# Patient Record
Sex: Female | Born: 1960 | Race: White | Hispanic: No | Marital: Married | Smoking: Never smoker
Health system: Southern US, Community
[De-identification: ages and names within clinical notes are randomized; demographics above are authoritative.]

## PROBLEM LIST (undated history)

## (undated) DIAGNOSIS — M069 Rheumatoid arthritis, unspecified: Secondary | ICD-10-CM

## (undated) DIAGNOSIS — M25541 Pain in joints of right hand: Secondary | ICD-10-CM

## (undated) DIAGNOSIS — M6530 Trigger finger, unspecified finger: Secondary | ICD-10-CM

## (undated) DIAGNOSIS — Z9109 Other allergy status, other than to drugs and biological substances: Secondary | ICD-10-CM

## (undated) DIAGNOSIS — M542 Cervicalgia: Secondary | ICD-10-CM

## (undated) DIAGNOSIS — J309 Allergic rhinitis, unspecified: Secondary | ICD-10-CM

## (undated) HISTORY — PX: NO PAST SURGERIES: SHX2092

---
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
## 1999-03-06 ENCOUNTER — Other Ambulatory Visit: Admission: RE | Admit: 1999-03-06 | Discharge: 1999-03-06 | Payer: Self-pay | Admitting: Obstetrics and Gynecology

## 2000-05-09 ENCOUNTER — Other Ambulatory Visit: Admission: RE | Admit: 2000-05-09 | Discharge: 2000-05-09 | Payer: Self-pay | Admitting: Obstetrics and Gynecology

## 2001-09-07 ENCOUNTER — Other Ambulatory Visit: Admission: RE | Admit: 2001-09-07 | Discharge: 2001-09-07 | Payer: Self-pay | Admitting: Obstetrics and Gynecology

## 2002-12-13 ENCOUNTER — Other Ambulatory Visit: Admission: RE | Admit: 2002-12-13 | Discharge: 2002-12-13 | Payer: Self-pay | Admitting: Obstetrics and Gynecology

## 2004-08-15 ENCOUNTER — Other Ambulatory Visit: Admission: RE | Admit: 2004-08-15 | Discharge: 2004-08-15 | Payer: Self-pay | Admitting: Obstetrics and Gynecology

## 2004-08-24 ENCOUNTER — Encounter: Admission: RE | Admit: 2004-08-24 | Discharge: 2004-08-24 | Payer: Self-pay | Admitting: Obstetrics and Gynecology

## 2005-10-03 ENCOUNTER — Other Ambulatory Visit: Admission: RE | Admit: 2005-10-03 | Discharge: 2005-10-03 | Payer: Self-pay | Admitting: Obstetrics and Gynecology

## 2011-07-24 DIAGNOSIS — Z9109 Other allergy status, other than to drugs and biological substances: Secondary | ICD-10-CM

## 2011-07-24 HISTORY — DX: Other allergy status, other than to drugs and biological substances: Z91.09

## 2013-08-05 ENCOUNTER — Other Ambulatory Visit: Payer: Self-pay | Admitting: Obstetrics and Gynecology

## 2013-08-05 DIAGNOSIS — R928 Other abnormal and inconclusive findings on diagnostic imaging of breast: Secondary | ICD-10-CM

## 2013-08-20 ENCOUNTER — Ambulatory Visit
Admission: RE | Admit: 2013-08-20 | Discharge: 2013-08-20 | Disposition: A | Discharge status: Home / Self Care | Payer: BC Managed Care – PPO | Source: Ambulatory Visit | Attending: Obstetrics and Gynecology | Admitting: Obstetrics and Gynecology

## 2013-08-20 DIAGNOSIS — R928 Other abnormal and inconclusive findings on diagnostic imaging of breast: Secondary | ICD-10-CM

## 2014-08-09 ENCOUNTER — Other Ambulatory Visit: Payer: Self-pay | Admitting: Obstetrics and Gynecology

## 2014-08-09 DIAGNOSIS — R921 Mammographic calcification found on diagnostic imaging of breast: Secondary | ICD-10-CM

## 2014-08-16 ENCOUNTER — Other Ambulatory Visit: Payer: Self-pay | Admitting: Obstetrics and Gynecology

## 2014-08-16 DIAGNOSIS — Z780 Asymptomatic menopausal state: Secondary | ICD-10-CM

## 2014-09-01 ENCOUNTER — Ambulatory Visit
Admission: RE | Admit: 2014-09-01 | Discharge: 2014-09-01 | Disposition: A | Discharge status: Home / Self Care | Payer: BC Managed Care – PPO | Source: Ambulatory Visit | Attending: Obstetrics and Gynecology | Admitting: Obstetrics and Gynecology

## 2014-09-01 DIAGNOSIS — R921 Mammographic calcification found on diagnostic imaging of breast: Secondary | ICD-10-CM

## 2014-09-01 DIAGNOSIS — Z780 Asymptomatic menopausal state: Secondary | ICD-10-CM

## 2014-12-14 DIAGNOSIS — J302 Other seasonal allergic rhinitis: Secondary | ICD-10-CM

## 2014-12-14 DIAGNOSIS — J309 Allergic rhinitis, unspecified: Secondary | ICD-10-CM

## 2014-12-14 HISTORY — DX: Allergic rhinitis, unspecified: J30.9

## 2014-12-14 HISTORY — DX: Other seasonal allergic rhinitis: J30.2

## 2016-05-20 DIAGNOSIS — M545 Low back pain: Secondary | ICD-10-CM | POA: Diagnosis not present

## 2016-05-20 DIAGNOSIS — M542 Cervicalgia: Secondary | ICD-10-CM | POA: Diagnosis not present

## 2016-05-20 DIAGNOSIS — M791 Myalgia: Secondary | ICD-10-CM | POA: Diagnosis not present

## 2016-05-23 DIAGNOSIS — M5384 Other specified dorsopathies, thoracic region: Secondary | ICD-10-CM | POA: Diagnosis not present

## 2016-05-23 DIAGNOSIS — M25552 Pain in left hip: Secondary | ICD-10-CM | POA: Diagnosis not present

## 2016-05-23 DIAGNOSIS — M4604 Spinal enthesopathy, thoracic region: Secondary | ICD-10-CM | POA: Diagnosis not present

## 2016-05-23 DIAGNOSIS — R51 Headache: Secondary | ICD-10-CM | POA: Diagnosis not present

## 2016-05-23 DIAGNOSIS — M62830 Muscle spasm of back: Secondary | ICD-10-CM | POA: Diagnosis not present

## 2016-05-23 DIAGNOSIS — M542 Cervicalgia: Secondary | ICD-10-CM | POA: Diagnosis not present

## 2016-05-29 DIAGNOSIS — M4603 Spinal enthesopathy, cervicothoracic region: Secondary | ICD-10-CM | POA: Diagnosis not present

## 2016-05-29 DIAGNOSIS — M25552 Pain in left hip: Secondary | ICD-10-CM | POA: Diagnosis not present

## 2016-05-29 DIAGNOSIS — M542 Cervicalgia: Secondary | ICD-10-CM | POA: Diagnosis not present

## 2016-05-29 DIAGNOSIS — M5383 Other specified dorsopathies, cervicothoracic region: Secondary | ICD-10-CM | POA: Diagnosis not present

## 2016-05-29 DIAGNOSIS — R51 Headache: Secondary | ICD-10-CM | POA: Diagnosis not present

## 2016-05-29 DIAGNOSIS — M62830 Muscle spasm of back: Secondary | ICD-10-CM | POA: Diagnosis not present

## 2016-06-04 DIAGNOSIS — R51 Headache: Secondary | ICD-10-CM | POA: Diagnosis not present

## 2016-06-04 DIAGNOSIS — M4602 Spinal enthesopathy, cervical region: Secondary | ICD-10-CM | POA: Diagnosis not present

## 2016-06-04 DIAGNOSIS — M5382 Other specified dorsopathies, cervical region: Secondary | ICD-10-CM | POA: Diagnosis not present

## 2016-06-04 DIAGNOSIS — M62830 Muscle spasm of back: Secondary | ICD-10-CM | POA: Diagnosis not present

## 2016-06-04 DIAGNOSIS — M25552 Pain in left hip: Secondary | ICD-10-CM | POA: Diagnosis not present

## 2016-06-04 DIAGNOSIS — M542 Cervicalgia: Secondary | ICD-10-CM | POA: Diagnosis not present

## 2016-06-06 DIAGNOSIS — M62830 Muscle spasm of back: Secondary | ICD-10-CM | POA: Diagnosis not present

## 2016-06-06 DIAGNOSIS — M542 Cervicalgia: Secondary | ICD-10-CM | POA: Diagnosis not present

## 2016-06-06 DIAGNOSIS — R51 Headache: Secondary | ICD-10-CM | POA: Diagnosis not present

## 2016-06-06 DIAGNOSIS — M25552 Pain in left hip: Secondary | ICD-10-CM | POA: Diagnosis not present

## 2016-06-11 DIAGNOSIS — R51 Headache: Secondary | ICD-10-CM | POA: Diagnosis not present

## 2016-06-11 DIAGNOSIS — M25552 Pain in left hip: Secondary | ICD-10-CM | POA: Diagnosis not present

## 2016-06-11 DIAGNOSIS — M542 Cervicalgia: Secondary | ICD-10-CM | POA: Diagnosis not present

## 2016-06-11 DIAGNOSIS — M62830 Muscle spasm of back: Secondary | ICD-10-CM | POA: Diagnosis not present

## 2016-06-13 DIAGNOSIS — R51 Headache: Secondary | ICD-10-CM | POA: Diagnosis not present

## 2016-06-13 DIAGNOSIS — M25552 Pain in left hip: Secondary | ICD-10-CM | POA: Diagnosis not present

## 2016-06-13 DIAGNOSIS — M542 Cervicalgia: Secondary | ICD-10-CM | POA: Diagnosis not present

## 2016-06-13 DIAGNOSIS — M4603 Spinal enthesopathy, cervicothoracic region: Secondary | ICD-10-CM | POA: Diagnosis not present

## 2016-06-13 DIAGNOSIS — M62830 Muscle spasm of back: Secondary | ICD-10-CM | POA: Diagnosis not present

## 2016-06-13 DIAGNOSIS — M5383 Other specified dorsopathies, cervicothoracic region: Secondary | ICD-10-CM | POA: Diagnosis not present

## 2016-06-18 DIAGNOSIS — R51 Headache: Secondary | ICD-10-CM | POA: Diagnosis not present

## 2016-06-18 DIAGNOSIS — M4602 Spinal enthesopathy, cervical region: Secondary | ICD-10-CM | POA: Diagnosis not present

## 2016-06-18 DIAGNOSIS — M542 Cervicalgia: Secondary | ICD-10-CM | POA: Diagnosis not present

## 2016-06-18 DIAGNOSIS — M5382 Other specified dorsopathies, cervical region: Secondary | ICD-10-CM | POA: Diagnosis not present

## 2016-06-18 DIAGNOSIS — M25552 Pain in left hip: Secondary | ICD-10-CM | POA: Diagnosis not present

## 2016-06-18 DIAGNOSIS — M62830 Muscle spasm of back: Secondary | ICD-10-CM | POA: Diagnosis not present

## 2016-06-20 DIAGNOSIS — M4606 Spinal enthesopathy, lumbar region: Secondary | ICD-10-CM | POA: Diagnosis not present

## 2016-06-20 DIAGNOSIS — M62830 Muscle spasm of back: Secondary | ICD-10-CM | POA: Diagnosis not present

## 2016-06-20 DIAGNOSIS — R51 Headache: Secondary | ICD-10-CM | POA: Diagnosis not present

## 2016-06-20 DIAGNOSIS — M5386 Other specified dorsopathies, lumbar region: Secondary | ICD-10-CM | POA: Diagnosis not present

## 2016-06-20 DIAGNOSIS — M542 Cervicalgia: Secondary | ICD-10-CM | POA: Diagnosis not present

## 2016-06-20 DIAGNOSIS — M25552 Pain in left hip: Secondary | ICD-10-CM | POA: Diagnosis not present

## 2016-06-25 DIAGNOSIS — M25552 Pain in left hip: Secondary | ICD-10-CM | POA: Diagnosis not present

## 2016-06-25 DIAGNOSIS — R51 Headache: Secondary | ICD-10-CM | POA: Diagnosis not present

## 2016-06-25 DIAGNOSIS — M5383 Other specified dorsopathies, cervicothoracic region: Secondary | ICD-10-CM | POA: Diagnosis not present

## 2016-06-25 DIAGNOSIS — M542 Cervicalgia: Secondary | ICD-10-CM | POA: Diagnosis not present

## 2016-06-25 DIAGNOSIS — M4603 Spinal enthesopathy, cervicothoracic region: Secondary | ICD-10-CM | POA: Diagnosis not present

## 2016-06-25 DIAGNOSIS — M62830 Muscle spasm of back: Secondary | ICD-10-CM | POA: Diagnosis not present

## 2016-06-27 DIAGNOSIS — M4603 Spinal enthesopathy, cervicothoracic region: Secondary | ICD-10-CM | POA: Diagnosis not present

## 2016-06-27 DIAGNOSIS — M542 Cervicalgia: Secondary | ICD-10-CM | POA: Diagnosis not present

## 2016-06-27 DIAGNOSIS — M62830 Muscle spasm of back: Secondary | ICD-10-CM | POA: Diagnosis not present

## 2016-06-27 DIAGNOSIS — R51 Headache: Secondary | ICD-10-CM | POA: Diagnosis not present

## 2016-06-27 DIAGNOSIS — M5383 Other specified dorsopathies, cervicothoracic region: Secondary | ICD-10-CM | POA: Diagnosis not present

## 2016-06-27 DIAGNOSIS — M25552 Pain in left hip: Secondary | ICD-10-CM | POA: Diagnosis not present

## 2016-07-02 DIAGNOSIS — M62830 Muscle spasm of back: Secondary | ICD-10-CM | POA: Diagnosis not present

## 2016-07-02 DIAGNOSIS — M25552 Pain in left hip: Secondary | ICD-10-CM | POA: Diagnosis not present

## 2016-07-02 DIAGNOSIS — R51 Headache: Secondary | ICD-10-CM | POA: Diagnosis not present

## 2016-07-02 DIAGNOSIS — M542 Cervicalgia: Secondary | ICD-10-CM | POA: Diagnosis not present

## 2016-07-04 DIAGNOSIS — M62830 Muscle spasm of back: Secondary | ICD-10-CM | POA: Diagnosis not present

## 2016-07-04 DIAGNOSIS — M25522 Pain in left elbow: Secondary | ICD-10-CM | POA: Diagnosis not present

## 2016-07-04 DIAGNOSIS — R51 Headache: Secondary | ICD-10-CM | POA: Diagnosis not present

## 2016-07-04 DIAGNOSIS — M542 Cervicalgia: Secondary | ICD-10-CM | POA: Diagnosis not present

## 2016-07-09 DIAGNOSIS — R51 Headache: Secondary | ICD-10-CM | POA: Diagnosis not present

## 2016-07-09 DIAGNOSIS — M25552 Pain in left hip: Secondary | ICD-10-CM | POA: Diagnosis not present

## 2016-07-09 DIAGNOSIS — M62830 Muscle spasm of back: Secondary | ICD-10-CM | POA: Diagnosis not present

## 2016-07-09 DIAGNOSIS — M542 Cervicalgia: Secondary | ICD-10-CM | POA: Diagnosis not present

## 2016-07-11 DIAGNOSIS — H01004 Unspecified blepharitis left upper eyelid: Secondary | ICD-10-CM | POA: Diagnosis not present

## 2016-07-11 DIAGNOSIS — H01002 Unspecified blepharitis right lower eyelid: Secondary | ICD-10-CM | POA: Diagnosis not present

## 2016-07-11 DIAGNOSIS — M25552 Pain in left hip: Secondary | ICD-10-CM | POA: Diagnosis not present

## 2016-07-11 DIAGNOSIS — H524 Presbyopia: Secondary | ICD-10-CM | POA: Diagnosis not present

## 2016-07-11 DIAGNOSIS — M542 Cervicalgia: Secondary | ICD-10-CM | POA: Diagnosis not present

## 2016-07-11 DIAGNOSIS — M62830 Muscle spasm of back: Secondary | ICD-10-CM | POA: Diagnosis not present

## 2016-07-11 DIAGNOSIS — H01005 Unspecified blepharitis left lower eyelid: Secondary | ICD-10-CM | POA: Diagnosis not present

## 2016-07-11 DIAGNOSIS — H5203 Hypermetropia, bilateral: Secondary | ICD-10-CM | POA: Diagnosis not present

## 2016-07-11 DIAGNOSIS — R51 Headache: Secondary | ICD-10-CM | POA: Diagnosis not present

## 2016-07-11 DIAGNOSIS — H01001 Unspecified blepharitis right upper eyelid: Secondary | ICD-10-CM | POA: Diagnosis not present

## 2016-07-17 DIAGNOSIS — M25552 Pain in left hip: Secondary | ICD-10-CM | POA: Diagnosis not present

## 2016-07-17 DIAGNOSIS — M542 Cervicalgia: Secondary | ICD-10-CM | POA: Diagnosis not present

## 2016-07-17 DIAGNOSIS — R51 Headache: Secondary | ICD-10-CM | POA: Diagnosis not present

## 2016-07-17 DIAGNOSIS — M62830 Muscle spasm of back: Secondary | ICD-10-CM | POA: Diagnosis not present

## 2016-07-18 DIAGNOSIS — M542 Cervicalgia: Secondary | ICD-10-CM | POA: Diagnosis not present

## 2016-07-18 DIAGNOSIS — M25552 Pain in left hip: Secondary | ICD-10-CM | POA: Diagnosis not present

## 2016-07-18 DIAGNOSIS — R51 Headache: Secondary | ICD-10-CM | POA: Diagnosis not present

## 2016-07-18 DIAGNOSIS — M62830 Muscle spasm of back: Secondary | ICD-10-CM | POA: Diagnosis not present

## 2016-07-23 DIAGNOSIS — M25552 Pain in left hip: Secondary | ICD-10-CM | POA: Diagnosis not present

## 2016-07-23 DIAGNOSIS — M542 Cervicalgia: Secondary | ICD-10-CM | POA: Diagnosis not present

## 2016-07-23 DIAGNOSIS — M62830 Muscle spasm of back: Secondary | ICD-10-CM | POA: Diagnosis not present

## 2016-07-23 DIAGNOSIS — R51 Headache: Secondary | ICD-10-CM | POA: Diagnosis not present

## 2016-07-24 DIAGNOSIS — R51 Headache: Secondary | ICD-10-CM | POA: Diagnosis not present

## 2016-07-24 DIAGNOSIS — M62830 Muscle spasm of back: Secondary | ICD-10-CM | POA: Diagnosis not present

## 2016-07-24 DIAGNOSIS — M25552 Pain in left hip: Secondary | ICD-10-CM | POA: Diagnosis not present

## 2016-07-24 DIAGNOSIS — M542 Cervicalgia: Secondary | ICD-10-CM | POA: Diagnosis not present

## 2016-08-07 DIAGNOSIS — M25552 Pain in left hip: Secondary | ICD-10-CM | POA: Diagnosis not present

## 2016-08-07 DIAGNOSIS — M542 Cervicalgia: Secondary | ICD-10-CM | POA: Diagnosis not present

## 2016-08-07 DIAGNOSIS — R51 Headache: Secondary | ICD-10-CM | POA: Diagnosis not present

## 2016-08-07 DIAGNOSIS — M62830 Muscle spasm of back: Secondary | ICD-10-CM | POA: Diagnosis not present

## 2016-08-13 DIAGNOSIS — R51 Headache: Secondary | ICD-10-CM | POA: Diagnosis not present

## 2016-08-13 DIAGNOSIS — M62830 Muscle spasm of back: Secondary | ICD-10-CM | POA: Diagnosis not present

## 2016-08-13 DIAGNOSIS — M542 Cervicalgia: Secondary | ICD-10-CM | POA: Diagnosis not present

## 2016-08-13 DIAGNOSIS — M25552 Pain in left hip: Secondary | ICD-10-CM | POA: Diagnosis not present

## 2016-08-20 DIAGNOSIS — R51 Headache: Secondary | ICD-10-CM | POA: Diagnosis not present

## 2016-08-20 DIAGNOSIS — M25552 Pain in left hip: Secondary | ICD-10-CM | POA: Diagnosis not present

## 2016-08-20 DIAGNOSIS — M62830 Muscle spasm of back: Secondary | ICD-10-CM | POA: Diagnosis not present

## 2016-08-20 DIAGNOSIS — M542 Cervicalgia: Secondary | ICD-10-CM | POA: Diagnosis not present

## 2016-10-22 DIAGNOSIS — M25552 Pain in left hip: Secondary | ICD-10-CM | POA: Diagnosis not present

## 2016-10-22 DIAGNOSIS — M62830 Muscle spasm of back: Secondary | ICD-10-CM | POA: Diagnosis not present

## 2016-10-22 DIAGNOSIS — R51 Headache: Secondary | ICD-10-CM | POA: Diagnosis not present

## 2016-10-22 DIAGNOSIS — M542 Cervicalgia: Secondary | ICD-10-CM | POA: Diagnosis not present

## 2016-11-28 DIAGNOSIS — J209 Acute bronchitis, unspecified: Secondary | ICD-10-CM | POA: Diagnosis not present

## 2017-07-16 DIAGNOSIS — M545 Low back pain: Secondary | ICD-10-CM | POA: Diagnosis not present

## 2017-07-16 DIAGNOSIS — M79644 Pain in right finger(s): Secondary | ICD-10-CM | POA: Diagnosis not present

## 2017-08-05 DIAGNOSIS — M2550 Pain in unspecified joint: Secondary | ICD-10-CM | POA: Diagnosis not present

## 2017-08-05 DIAGNOSIS — M549 Dorsalgia, unspecified: Secondary | ICD-10-CM | POA: Diagnosis not present

## 2017-08-05 DIAGNOSIS — R5381 Other malaise: Secondary | ICD-10-CM | POA: Diagnosis not present

## 2017-08-29 ENCOUNTER — Encounter: Payer: Self-pay | Admitting: Cardiology

## 2017-08-29 DIAGNOSIS — H9312 Tinnitus, left ear: Secondary | ICD-10-CM | POA: Diagnosis not present

## 2017-08-29 DIAGNOSIS — R002 Palpitations: Secondary | ICD-10-CM | POA: Diagnosis not present

## 2017-08-29 DIAGNOSIS — F5101 Primary insomnia: Secondary | ICD-10-CM | POA: Diagnosis not present

## 2017-10-08 DIAGNOSIS — H04123 Dry eye syndrome of bilateral lacrimal glands: Secondary | ICD-10-CM | POA: Diagnosis not present

## 2017-10-08 DIAGNOSIS — M65319 Trigger thumb, unspecified thumb: Secondary | ICD-10-CM | POA: Diagnosis not present

## 2017-10-08 DIAGNOSIS — R768 Other specified abnormal immunological findings in serum: Secondary | ICD-10-CM | POA: Diagnosis not present

## 2017-10-08 DIAGNOSIS — M25649 Stiffness of unspecified hand, not elsewhere classified: Secondary | ICD-10-CM | POA: Diagnosis not present

## 2017-10-22 DIAGNOSIS — H04123 Dry eye syndrome of bilateral lacrimal glands: Secondary | ICD-10-CM | POA: Diagnosis not present

## 2017-10-22 DIAGNOSIS — R768 Other specified abnormal immunological findings in serum: Secondary | ICD-10-CM | POA: Diagnosis not present

## 2018-01-12 DIAGNOSIS — M2550 Pain in unspecified joint: Secondary | ICD-10-CM | POA: Diagnosis not present

## 2018-01-12 DIAGNOSIS — R768 Other specified abnormal immunological findings in serum: Secondary | ICD-10-CM | POA: Diagnosis not present

## 2018-01-21 DIAGNOSIS — M25541 Pain in joints of right hand: Secondary | ICD-10-CM

## 2018-01-21 DIAGNOSIS — H16223 Keratoconjunctivitis sicca, not specified as Sjogren's, bilateral: Secondary | ICD-10-CM | POA: Diagnosis not present

## 2018-01-21 DIAGNOSIS — H0100A Unspecified blepharitis right eye, upper and lower eyelids: Secondary | ICD-10-CM | POA: Diagnosis not present

## 2018-01-21 DIAGNOSIS — H2513 Age-related nuclear cataract, bilateral: Secondary | ICD-10-CM | POA: Diagnosis not present

## 2018-01-21 DIAGNOSIS — H0100B Unspecified blepharitis left eye, upper and lower eyelids: Secondary | ICD-10-CM | POA: Diagnosis not present

## 2018-01-21 DIAGNOSIS — M25542 Pain in joints of left hand: Secondary | ICD-10-CM

## 2018-01-21 HISTORY — DX: Pain in joints of right hand: M25.541

## 2018-01-21 HISTORY — DX: Pain in joints of left hand: M25.542

## 2018-02-25 DIAGNOSIS — M5386 Other specified dorsopathies, lumbar region: Secondary | ICD-10-CM | POA: Diagnosis not present

## 2018-02-25 DIAGNOSIS — M9905 Segmental and somatic dysfunction of pelvic region: Secondary | ICD-10-CM | POA: Diagnosis not present

## 2018-02-25 DIAGNOSIS — M9902 Segmental and somatic dysfunction of thoracic region: Secondary | ICD-10-CM | POA: Diagnosis not present

## 2018-02-25 DIAGNOSIS — M9903 Segmental and somatic dysfunction of lumbar region: Secondary | ICD-10-CM | POA: Diagnosis not present

## 2018-02-26 DIAGNOSIS — M65312 Trigger thumb, left thumb: Secondary | ICD-10-CM | POA: Diagnosis not present

## 2018-02-26 DIAGNOSIS — M6530 Trigger finger, unspecified finger: Secondary | ICD-10-CM

## 2018-02-26 DIAGNOSIS — M79642 Pain in left hand: Secondary | ICD-10-CM | POA: Diagnosis not present

## 2018-02-26 DIAGNOSIS — M65311 Trigger thumb, right thumb: Secondary | ICD-10-CM | POA: Diagnosis not present

## 2018-02-26 DIAGNOSIS — M069 Rheumatoid arthritis, unspecified: Secondary | ICD-10-CM

## 2018-02-26 DIAGNOSIS — M79641 Pain in right hand: Secondary | ICD-10-CM | POA: Diagnosis not present

## 2018-02-26 HISTORY — DX: Trigger finger, unspecified finger: M65.30

## 2018-02-26 HISTORY — DX: Rheumatoid arthritis, unspecified: M06.9

## 2018-02-27 DIAGNOSIS — M9905 Segmental and somatic dysfunction of pelvic region: Secondary | ICD-10-CM | POA: Diagnosis not present

## 2018-02-27 DIAGNOSIS — M5386 Other specified dorsopathies, lumbar region: Secondary | ICD-10-CM | POA: Diagnosis not present

## 2018-02-27 DIAGNOSIS — M9903 Segmental and somatic dysfunction of lumbar region: Secondary | ICD-10-CM | POA: Diagnosis not present

## 2018-02-27 DIAGNOSIS — M9902 Segmental and somatic dysfunction of thoracic region: Secondary | ICD-10-CM | POA: Diagnosis not present

## 2018-03-02 DIAGNOSIS — M9903 Segmental and somatic dysfunction of lumbar region: Secondary | ICD-10-CM | POA: Diagnosis not present

## 2018-03-02 DIAGNOSIS — M5386 Other specified dorsopathies, lumbar region: Secondary | ICD-10-CM | POA: Diagnosis not present

## 2018-03-02 DIAGNOSIS — M9905 Segmental and somatic dysfunction of pelvic region: Secondary | ICD-10-CM | POA: Diagnosis not present

## 2018-03-02 DIAGNOSIS — M9902 Segmental and somatic dysfunction of thoracic region: Secondary | ICD-10-CM | POA: Diagnosis not present

## 2018-03-11 DIAGNOSIS — M9902 Segmental and somatic dysfunction of thoracic region: Secondary | ICD-10-CM | POA: Diagnosis not present

## 2018-03-11 DIAGNOSIS — M9905 Segmental and somatic dysfunction of pelvic region: Secondary | ICD-10-CM | POA: Diagnosis not present

## 2018-03-11 DIAGNOSIS — M5386 Other specified dorsopathies, lumbar region: Secondary | ICD-10-CM | POA: Diagnosis not present

## 2018-03-11 DIAGNOSIS — M9903 Segmental and somatic dysfunction of lumbar region: Secondary | ICD-10-CM | POA: Diagnosis not present

## 2018-03-13 DIAGNOSIS — M9903 Segmental and somatic dysfunction of lumbar region: Secondary | ICD-10-CM | POA: Diagnosis not present

## 2018-03-13 DIAGNOSIS — M9905 Segmental and somatic dysfunction of pelvic region: Secondary | ICD-10-CM | POA: Diagnosis not present

## 2018-03-13 DIAGNOSIS — M5386 Other specified dorsopathies, lumbar region: Secondary | ICD-10-CM | POA: Diagnosis not present

## 2018-03-13 DIAGNOSIS — M9902 Segmental and somatic dysfunction of thoracic region: Secondary | ICD-10-CM | POA: Diagnosis not present

## 2018-03-17 DIAGNOSIS — M5386 Other specified dorsopathies, lumbar region: Secondary | ICD-10-CM | POA: Diagnosis not present

## 2018-03-17 DIAGNOSIS — M9903 Segmental and somatic dysfunction of lumbar region: Secondary | ICD-10-CM | POA: Diagnosis not present

## 2018-03-17 DIAGNOSIS — M9902 Segmental and somatic dysfunction of thoracic region: Secondary | ICD-10-CM | POA: Diagnosis not present

## 2018-03-17 DIAGNOSIS — M9905 Segmental and somatic dysfunction of pelvic region: Secondary | ICD-10-CM | POA: Diagnosis not present

## 2018-03-19 DIAGNOSIS — M9902 Segmental and somatic dysfunction of thoracic region: Secondary | ICD-10-CM | POA: Diagnosis not present

## 2018-03-19 DIAGNOSIS — M9903 Segmental and somatic dysfunction of lumbar region: Secondary | ICD-10-CM | POA: Diagnosis not present

## 2018-03-19 DIAGNOSIS — M9905 Segmental and somatic dysfunction of pelvic region: Secondary | ICD-10-CM | POA: Diagnosis not present

## 2018-03-19 DIAGNOSIS — M5386 Other specified dorsopathies, lumbar region: Secondary | ICD-10-CM | POA: Diagnosis not present

## 2018-04-08 DIAGNOSIS — Z01419 Encounter for gynecological examination (general) (routine) without abnormal findings: Secondary | ICD-10-CM | POA: Diagnosis not present

## 2018-04-08 DIAGNOSIS — Z6820 Body mass index (BMI) 20.0-20.9, adult: Secondary | ICD-10-CM | POA: Diagnosis not present

## 2018-04-08 DIAGNOSIS — Z1231 Encounter for screening mammogram for malignant neoplasm of breast: Secondary | ICD-10-CM | POA: Diagnosis not present

## 2018-04-09 ENCOUNTER — Other Ambulatory Visit: Payer: Self-pay | Admitting: Obstetrics and Gynecology

## 2018-04-09 DIAGNOSIS — R928 Other abnormal and inconclusive findings on diagnostic imaging of breast: Secondary | ICD-10-CM

## 2018-04-13 ENCOUNTER — Ambulatory Visit
Admission: RE | Admit: 2018-04-13 | Discharge: 2018-04-13 | Disposition: A | Discharge status: Home / Self Care | Payer: BLUE CROSS/BLUE SHIELD | Source: Ambulatory Visit | Attending: Obstetrics and Gynecology | Admitting: Obstetrics and Gynecology

## 2018-04-13 DIAGNOSIS — M79672 Pain in left foot: Secondary | ICD-10-CM | POA: Diagnosis not present

## 2018-04-13 DIAGNOSIS — R928 Other abnormal and inconclusive findings on diagnostic imaging of breast: Secondary | ICD-10-CM

## 2018-04-13 DIAGNOSIS — R922 Inconclusive mammogram: Secondary | ICD-10-CM | POA: Diagnosis not present

## 2018-04-13 DIAGNOSIS — N6489 Other specified disorders of breast: Secondary | ICD-10-CM | POA: Diagnosis not present

## 2018-04-16 ENCOUNTER — Other Ambulatory Visit: Payer: Self-pay

## 2018-07-29 DIAGNOSIS — Z0000 Encounter for general adult medical examination without abnormal findings: Secondary | ICD-10-CM | POA: Diagnosis not present

## 2018-07-31 DIAGNOSIS — Z0000 Encounter for general adult medical examination without abnormal findings: Secondary | ICD-10-CM | POA: Diagnosis not present

## 2018-11-26 DIAGNOSIS — M542 Cervicalgia: Secondary | ICD-10-CM | POA: Diagnosis not present

## 2018-11-26 DIAGNOSIS — L040 Acute lymphadenitis of face, head and neck: Secondary | ICD-10-CM | POA: Diagnosis not present

## 2018-11-26 DIAGNOSIS — R51 Headache: Secondary | ICD-10-CM | POA: Diagnosis not present

## 2018-12-23 DIAGNOSIS — M9902 Segmental and somatic dysfunction of thoracic region: Secondary | ICD-10-CM | POA: Diagnosis not present

## 2018-12-23 DIAGNOSIS — M9905 Segmental and somatic dysfunction of pelvic region: Secondary | ICD-10-CM | POA: Diagnosis not present

## 2018-12-23 DIAGNOSIS — M542 Cervicalgia: Secondary | ICD-10-CM | POA: Diagnosis not present

## 2018-12-23 DIAGNOSIS — M9903 Segmental and somatic dysfunction of lumbar region: Secondary | ICD-10-CM | POA: Diagnosis not present

## 2018-12-23 DIAGNOSIS — G44209 Tension-type headache, unspecified, not intractable: Secondary | ICD-10-CM | POA: Diagnosis not present

## 2018-12-23 DIAGNOSIS — M791 Myalgia, unspecified site: Secondary | ICD-10-CM | POA: Diagnosis not present

## 2018-12-23 DIAGNOSIS — I10 Essential (primary) hypertension: Secondary | ICD-10-CM | POA: Diagnosis not present

## 2019-02-03 DIAGNOSIS — R51 Headache: Secondary | ICD-10-CM | POA: Diagnosis not present

## 2019-02-03 DIAGNOSIS — M542 Cervicalgia: Secondary | ICD-10-CM | POA: Diagnosis not present

## 2019-02-04 DIAGNOSIS — G4486 Cervicogenic headache: Secondary | ICD-10-CM

## 2019-02-04 DIAGNOSIS — M542 Cervicalgia: Secondary | ICD-10-CM

## 2019-02-04 HISTORY — DX: Cervicalgia: M54.2

## 2019-02-04 HISTORY — DX: Cervicogenic headache: G44.86

## 2019-02-10 DIAGNOSIS — R51 Headache: Secondary | ICD-10-CM | POA: Diagnosis not present

## 2019-02-10 DIAGNOSIS — R1310 Dysphagia, unspecified: Secondary | ICD-10-CM | POA: Diagnosis not present

## 2019-02-10 DIAGNOSIS — M542 Cervicalgia: Secondary | ICD-10-CM | POA: Diagnosis not present

## 2019-03-12 DIAGNOSIS — R002 Palpitations: Secondary | ICD-10-CM | POA: Diagnosis not present

## 2019-03-12 DIAGNOSIS — R51 Headache: Secondary | ICD-10-CM | POA: Diagnosis not present

## 2019-03-12 DIAGNOSIS — K219 Gastro-esophageal reflux disease without esophagitis: Secondary | ICD-10-CM | POA: Diagnosis not present

## 2019-03-16 ENCOUNTER — Telehealth: Payer: Self-pay | Admitting: *Deleted

## 2019-03-16 NOTE — Telephone Encounter (Signed)
REFERRAL SENT TO SCHEDULING AND NOTES ON FILE FROM DR. ALAN ROSS EAGLE AT [facility] [phone] °

## 2019-03-24 DIAGNOSIS — M542 Cervicalgia: Secondary | ICD-10-CM | POA: Diagnosis not present

## 2019-03-24 DIAGNOSIS — R51 Headache: Secondary | ICD-10-CM | POA: Diagnosis not present

## 2019-03-29 DIAGNOSIS — M542 Cervicalgia: Secondary | ICD-10-CM | POA: Diagnosis not present

## 2019-03-29 DIAGNOSIS — R51 Headache: Secondary | ICD-10-CM | POA: Diagnosis not present

## 2019-04-01 DIAGNOSIS — K219 Gastro-esophageal reflux disease without esophagitis: Secondary | ICD-10-CM | POA: Diagnosis not present

## 2019-04-01 DIAGNOSIS — R1310 Dysphagia, unspecified: Secondary | ICD-10-CM | POA: Diagnosis not present

## 2019-04-01 DIAGNOSIS — Z800 Family history of malignant neoplasm of digestive organs: Secondary | ICD-10-CM | POA: Diagnosis not present

## 2019-04-01 DIAGNOSIS — Z1211 Encounter for screening for malignant neoplasm of colon: Secondary | ICD-10-CM | POA: Diagnosis not present

## 2019-04-12 DIAGNOSIS — M542 Cervicalgia: Secondary | ICD-10-CM | POA: Diagnosis not present

## 2019-04-12 DIAGNOSIS — R51 Headache: Secondary | ICD-10-CM | POA: Diagnosis not present

## 2019-04-13 DIAGNOSIS — R05 Cough: Secondary | ICD-10-CM | POA: Diagnosis not present

## 2019-05-12 ENCOUNTER — Ambulatory Visit
Admission: RE | Admit: 2019-05-12 | Discharge: 2019-05-12 | Disposition: A | Discharge status: Home / Self Care | Payer: BLUE CROSS/BLUE SHIELD | Source: Ambulatory Visit | Attending: Family Medicine | Admitting: Family Medicine

## 2019-05-12 ENCOUNTER — Other Ambulatory Visit: Payer: Self-pay | Admitting: Family Medicine

## 2019-05-12 DIAGNOSIS — R059 Cough, unspecified: Secondary | ICD-10-CM

## 2019-05-12 DIAGNOSIS — R05 Cough: Secondary | ICD-10-CM | POA: Diagnosis not present

## 2019-05-18 DIAGNOSIS — L659 Nonscarring hair loss, unspecified: Secondary | ICD-10-CM | POA: Diagnosis not present

## 2019-05-21 ENCOUNTER — Other Ambulatory Visit: Payer: Self-pay

## 2019-05-31 ENCOUNTER — Telehealth: Payer: Self-pay | Admitting: *Deleted

## 2019-05-31 NOTE — Telephone Encounter (Signed)
Pt requesting old scratch test results. Located old chart and emailed to pt at [email or web address]

## 2019-06-11 ENCOUNTER — Other Ambulatory Visit: Payer: Self-pay

## 2019-06-11 ENCOUNTER — Encounter: Payer: Self-pay | Admitting: Cardiology

## 2019-06-11 ENCOUNTER — Ambulatory Visit (INDEPENDENT_AMBULATORY_CARE_PROVIDER_SITE_OTHER): Payer: BC Managed Care – PPO | Admitting: Cardiology

## 2019-06-11 DIAGNOSIS — R0609 Other forms of dyspnea: Secondary | ICD-10-CM

## 2019-06-11 DIAGNOSIS — R0789 Other chest pain: Secondary | ICD-10-CM | POA: Insufficient documentation

## 2019-06-11 DIAGNOSIS — R0600 Dyspnea, unspecified: Secondary | ICD-10-CM

## 2019-06-11 DIAGNOSIS — R002 Palpitations: Secondary | ICD-10-CM

## 2019-06-11 HISTORY — DX: Palpitations: R00.2

## 2019-06-11 HISTORY — DX: Other forms of dyspnea: R06.09

## 2019-06-11 HISTORY — DX: Other chest pain: R07.89

## 2019-06-11 HISTORY — DX: Dyspnea, unspecified: R06.00

## 2019-06-11 NOTE — Progress Notes (Signed)
Cardiology Consultation:    Date:  06/11/2019   ID:  Linda Garrett, DOB Jun 13, 1961, MRN [id number]  PCP:  Linda Cruel, MD  Cardiologist:  Jenne Campus, MD   Referring MD: Linda Cruel, MD   Chief Complaint  Patient presents with  . Chest Pain    Since February, Headache, Cough and Chest pain (Random)  . Palpitations  . Feels like being unable to take a deep breath  . Fatigue    History of Present Illness:    Linda Garrett is a 58 y.o. female who is being seen today for the evaluation of chest pain at the request of Linda Cruel, MD.  She is a healthy woman with no significant risk factors for coronary artery disease.  She never smoked does not have hypertension does not have diabetes her cholesterol HDL is quite high.  She was referred to [place] because of 2 complaints #1 when she lay down at evening time she can hear and feel palpitations she would hear and feel her heart speeding up as well as skipping some beats.  That make her feel very uncomfortable.  She does not have any dizziness no passing out.  There is no shortness of breath associated with this sensation.  She also described to have some heavy sensation in the chest when it happens.  That typically happen at rest when she is lying down at evening time.  It does not happen with exertion.  She also described to have pain in different portion of her body including neck.  She does have some cervical spine issues.  She is also getting tired very easily.  And she is very concerned about her health and she is worried that something pretty bad is happening to her. Does not have significant risk factors for coronary artery disease. Does not exercise on the regular basis but walks sometimes with no difficulties.  Past Medical History:  Diagnosis Date  . Arthralgia of both hands 01/21/2018  . Cervicogenic headache 02/04/2019  . Chronic allergic rhinitis 12/14/2014  . Environmental allergies 07/24/2011  . Neck pain  02/04/2019  . Rheumatoid arthritis ([place]) 02/26/2018  . Trigger finger 02/26/2018    Past Surgical History:  Procedure Laterality Date  . NO PAST SURGERIES      Current Medications: Current Meds  Medication Sig  . Ibuprofen (ADVIL) 200 MG CAPS Take by mouth as needed.  . loratadine (CLARITIN) 10 MG tablet Take 10 mg by mouth daily as needed for allergies.     Allergies:   Tramadol and Erythromycin   Social History   Socioeconomic History  . Marital status: Married    Spouse name: Not on file  . Number of children: Not on file  . Years of education: Not on file  . Highest education level: Not on file  Occupational History  . Not on file  Social Needs  . Financial resource strain: Not on file  . Food insecurity    Worry: Not on file    Inability: Not on file  . Transportation needs    Medical: Not on file    Non-medical: Not on file  Tobacco Use  . Smoking status: Never Smoker  . Smokeless tobacco: Never Used  Substance and Sexual Activity  . Alcohol use: Yes  . Drug use: Never  . Sexual activity: Not on file  Lifestyle  . Physical activity    Days per week: Not on file    Minutes per session:  Not on file  . Stress: Not on file  Relationships  . Social Musician on phone: Not on file    Gets together: Not on file    Attends religious service: Not on file    Active member of club or organization: Not on file    Attends meetings of clubs or organizations: Not on file    Relationship status: Not on file  Other Topics Concern  . Not on file  Social History Narrative  . Not on file     Family History: The patient's family history includes Barrett's esophagus in her father; COPD in her father; Heart failure in her mother; Hypertension in her mother; Lymphoma in her father; Stroke in her mother. ROS:   Please see the history of present illness.    All 14 point review of systems negative except as described per history of present illness.   EKGs/Labs/Other Studies Reviewed:    The following studies were reviewed today:   EKG:  EKG is  ordered today.  The ekg ordered today demonstrates normal sinus rhythm, normal P interval, nonspecific ST-T segment changes  Recent Labs: No results found for requested labs within last 8760 hours.  Recent Lipid Panel No results found for: CHOL, TRIG, HDL, CHOLHDL, VLDL, LDLCALC, LDLDIRECT  Physical Exam:    VS:  BP 140/80   Pulse (!) 103   Ht 5' 3.5" (1.613 m)   Wt 115 lb 1.9 oz (52.2 kg)   SpO2 99%   BMI 20.07 kg/m     Wt Readings from Last 3 Encounters:  06/11/19 115 lb 1.9 oz (52.2 kg)     GEN:  Well nourished, well developed in no acute distress HEENT: Normal NECK: No JVD; No carotid bruits LYMPHATICS: No lymphadenopathy CARDIAC: RRR, no murmurs, no rubs, no gallops RESPIRATORY:  Clear to auscultation without rales, wheezing or rhonchi  ABDOMEN: Soft, non-tender, non-distended MUSCULOSKELETAL:  No edema; No deformity  SKIN: Warm and dry NEUROLOGIC:  Alert and oriented x 3 PSYCHIATRIC:  Normal affect   ASSESSMENT:    1. Atypical chest pain   2. Palpitations   3. Dyspnea on exertion    PLAN:    In order of problems listed above:  1. Atypical chest pain in a lady with no significant risk factors for coronary artery disease.  We talking length about the situation I told her I doubt very much that this is related to her heart.  I still think this will be beneficial to do calcium score trying to determine what her prognosis is.  I will not initiate any medication right now until we have more clear situation if we dealing with any problem. 2. Palpitations.  I will ask you to wear 0 patch for about a week to see exactly what kind of arrhythmia if any with dealing with.  Based on that we will decide if to order echocardiogram. 3. Dyspnea on exertion will decide about doing echocardiogram based on the results of the test that being already ordered. 4. Cholesterol reviewed  with her LDL is 106 however her HDL is significantly high which is good.   Medication Adjustments/Labs and Tests Ordered: Current medicines are reviewed at length with the patient today.  Concerns regarding medicines are outlined above.  No orders of the defined types were placed in this encounter.  No orders of the defined types were placed in this encounter.   Signed, Georgeanna Lea, MD, [facility]. 06/11/2019 9:50 AM  [facility]

## 2019-06-11 NOTE — Patient Instructions (Signed)
Medication Instructions:  Your physician recommends that you continue on your current medications as directed. Please refer to the Current Medication list given to you today.  If you need a refill on your cardiac medications before your next appointment, please call your pharmacy.   Lab work: NONE  If you have labs (blood work) drawn today and your tests are completely normal, you will receive your results only by:  [place] (if you have MyChart) OR  A paper copy in the mail If you have any lab test that is abnormal or we need to change your treatment, we will call you to review the results.  Testing/Procedures: Your physician has recommended that you wear a holter monitor. Holter monitors are medical devices that record the hearts electrical activity. Doctors most often use these monitors to diagnose arrhythmias. Arrhythmias are problems with the speed or rhythm of the heartbeat. The monitor is a small, portable device. You can wear one while you do your normal daily activities. This is usually used to diagnose what is causing palpitations/syncope (passing out).  WEAR FOR 7 DAYS  Non-Cardiac CT scanning, (CAT scanning), is a noninvasive, special x-ray that produces cross-sectional images of the body using x-rays and a computer. CT scans help physicians diagnose and treat medical conditions. For some CT exams, a contrast material is used to enhance visibility in the area of the body being studied. CT scans provide greater clarity and reveal more details than regular x-ray exams.      Follow-Up: At [facility], you and your health needs are our priority.  As part of our continuing mission to provide you with exceptional heart care, we have created designated Provider Care Teams.  These Care Teams include your primary Cardiologist (physician) and Advanced Practice Providers (APPs -  Physician Assistants and Nurse Practitioners) who all work together to provide you with the care  you need, when you need it. You will need a follow up appointment in 1 months.  Please call our office 2 months in advance to schedule this appointment.  You may see No primary care provider on file. or another member of our [employer] Provider Team in [place]: Shirlee More, MD  Jyl Heinz, MD  Any Other Special Instructions Will Be Listed Below (If Applicable).   Coronary Calcium Scan A coronary calcium scan is an imaging test used to look for deposits of calcium and other fatty materials (plaques) in the inner lining of the blood vessels of the heart (coronary arteries). These deposits of calcium and plaques can partly clog and narrow the coronary arteries without producing any symptoms or warning signs. This puts a person at risk for a heart attack. This test can detect these deposits before symptoms develop. Tell a health care provider about:  Any allergies you have.  All medicines you are taking, including vitamins, herbs, eye drops, creams, and over-the-counter medicines.  Any problems you or family members have had with anesthetic medicines.  Any blood disorders you have.  Any surgeries you have had.  Any medical conditions you have.  Whether you are pregnant or may be pregnant. What are the risks? Generally, this is a safe procedure. However, problems may occur, including:  Harm to a pregnant woman and her unborn baby. This test involves the use of radiation. Radiation exposure can be dangerous to a pregnant woman and her unborn baby. If you are pregnant, you generally should not have this procedure done.  Slight increase in the risk of cancer.  This is because of the radiation involved in the test. What happens before the procedure? No preparation is needed for this procedure. What happens during the procedure?   You will undress and remove any jewelry around your neck or chest.  You will put on a hospital gown.  Sticky electrodes will be placed on your  chest. The electrodes will be connected to an electrocardiogram (ECG) machine to record a tracing of the electrical activity of your heart.  A CT scanner will take pictures of your heart. During this time, you will be asked to lie still and hold your breath for 2-3 seconds while a picture of your heart is being taken. The procedure may vary among health care providers and hospitals. What happens after the procedure?  You can get dressed.  You can return to your normal activities.  It is up to you to get the results of your test. Ask your health care provider, or the department that is doing the test, when your results will be ready. Summary  A coronary calcium scan is an imaging test used to look for deposits of calcium and other fatty materials (plaques) in the inner lining of the blood vessels of the heart (coronary arteries).  Generally, this is a safe procedure. Tell your health care provider if you are pregnant or may be pregnant.  No preparation is needed for this procedure.  A CT scanner will take pictures of your heart.  You can return to your normal activities after the scan is done. This information is not intended to replace advice given to you by your health care provider. Make sure you discuss any questions you have with your health care provider. Document Released: 04/04/2008 Document Revised: 09/19/2017 Document Reviewed: 08/26/2016 Elsevier Patient Education  2020 [employer].

## 2019-06-11 NOTE — Addendum Note (Signed)
Addended by: Polly Cobia A on: 06/11/2019 10:14 AM   Modules accepted: Orders

## 2019-06-15 ENCOUNTER — Other Ambulatory Visit: Payer: Self-pay

## 2019-06-15 ENCOUNTER — Telehealth: Payer: Self-pay | Admitting: Cardiology

## 2019-06-15 ENCOUNTER — Ambulatory Visit: Payer: BC Managed Care – PPO | Admitting: Allergy and Immunology

## 2019-06-15 ENCOUNTER — Encounter: Payer: Self-pay | Admitting: Allergy and Immunology

## 2019-06-15 VITALS — BP 148/86 | HR 88 | Temp 98.3°F | Resp 16 | Ht 63.5 in | Wt 116.4 lb

## 2019-06-15 DIAGNOSIS — K224 Dyskinesia of esophagus: Secondary | ICD-10-CM | POA: Diagnosis not present

## 2019-06-15 DIAGNOSIS — L659 Nonscarring hair loss, unspecified: Secondary | ICD-10-CM | POA: Diagnosis not present

## 2019-06-15 DIAGNOSIS — K219 Gastro-esophageal reflux disease without esophagitis: Secondary | ICD-10-CM

## 2019-06-15 DIAGNOSIS — R0789 Other chest pain: Secondary | ICD-10-CM | POA: Diagnosis not present

## 2019-06-15 DIAGNOSIS — J3089 Other allergic rhinitis: Secondary | ICD-10-CM | POA: Diagnosis not present

## 2019-06-15 DIAGNOSIS — R002 Palpitations: Secondary | ICD-10-CM

## 2019-06-15 DIAGNOSIS — L505 Cholinergic urticaria: Secondary | ICD-10-CM

## 2019-06-15 MED ORDER — ESOMEPRAZOLE MAGNESIUM 40 MG PO CPDR: 40.0000 mg | DELAYED_RELEASE_CAPSULE | Freq: Every morning | ORAL | 5 refills | Status: DC

## 2019-06-15 MED ORDER — FAMOTIDINE 40 MG PO TABS: 40.0000 mg | ORAL_TABLET | Freq: Every evening | ORAL | 5 refills | Status: DC

## 2019-06-15 NOTE — Patient Instructions (Addendum)
  1.  Allergen avoidance measures?  2.  Treat and prevent reflux:   A.  Consolidate caffeine and chocolate consumption   B.  Nexium 40 mg tablet 1 time per day in a.m.  C.  Famotidine 40 mg tablet 1 time per day in p.m.  3.  If needed:   A.  Loratadine 10 mg - 1 tablet 1 time per day (dry eye?)  B.  OTC Systane gel drops at night  4.  Obtain endoscopy with Dr. Collene Mares to look for eosinophilic esophagitis giving rise to esophageal dysmotility and chest pain  5.  Obtain a barium swallow for esophageal dysmotility  6.  Obtain blood-TSH, T4, TP for hair loss and palpitations  7.  Further evaluation?  8.  Return to clinic if needed

## 2019-06-15 NOTE — Telephone Encounter (Signed)
Patient called to check the status of her monitor because she has not received.  Since she hasnot received it yet, she wants to delay wearing it because of some things she has coming up.  Please call patient to discuss

## 2019-06-15 NOTE — Progress Notes (Signed)
[place]    NEW PATIENT NOTE  Referring Provider: No ref. provider found Primary Provider: Daisy Florooss, Charles Alan, MD Date of office visit: 06/15/2019    Subjective:   Chief Complaint:  Linda Garrett (DOB: Oct 04, 1961) is a 58 y.o. female who presents to the clinic on 06/15/2019 with a chief complaint of No chief complaint on file. Marland Kitchen.     HPI: Linda Garrett presents to this clinic in evaluation of a multitude of different issues.  First, she has a long history of allergic rhinoconjunctivitis evaluated in the past by an allergist and treated at this point in time with as needed use of Claritin which works relatively well.  Second, she has developed an issue where if she becomes hot and sweaty she gets intensely itchy and develops red spots on her skin that appears to resolve when she cools down.  Sometimes if she takes an antihistamine prior to this heat exposure this reaction does not develop.  She has no associated systemic or constitutional symptoms with this reaction.  Third, she has been having problems with burping and belching and regurgitation and food getting caught up in her chest.  She did see a gastroenterologist and she is scheduled to have an upper endoscopy and a colonoscopy.  She was started on Nexium and this issue is better on Nexium.  In conjunction with these GI issues she is also had some strangled voice when she talks. She drinks a 32 ounce Diet Coke per day and has chocolate on a daily basis.  Fourth, she has developed atypical chest pain for which she has been evaluated by cardiology and has had a normal chest x-ray and is scheduled to have a heart monitor for palpitations and also to undergo a CT scan of her chest.  She does not have coughing or wheezing and she does not have a history of exercise-induced bronchospastic symptoms or cold air induced bronchospastic symptoms.  Fifth, she has dry eyes syndrome with intensely dry eyes  at nighttime.  Sixth, she has had a history of headache and a history of hand pain which is been evaluated by a neurologist and rheumatology respectively and has undergone diagnostic testing for a CNS defect and a connective tissue disease both of which have been negative.  Currently she has headaches that sometimes leave her dizzy that start in the back of her neck and sometimes come around to the front or sides of her neck.  At this point in time her frequency of headaches is much better than it was back in February and March 2020.  Now she only has 1 of these headaches every several days and it only lasts about 2 hours or so and sometimes is treated with Advil.  Seventh, she has been having some problems with hair loss the past several months  Past Medical History:  Diagnosis Date  . Arthralgia of both hands 01/21/2018  . Cervicogenic headache 02/04/2019  . Chronic allergic rhinitis 12/14/2014  . Environmental allergies 07/24/2011  . Neck pain 02/04/2019  . Rheumatoid arthritis ([facility]) 02/26/2018  . Trigger finger 02/26/2018    Past Surgical History:  Procedure Laterality Date  . NO PAST SURGERIES      Allergies as of 06/15/2019      Reactions   Tramadol Shortness Of Breath   Felt like she was going to stop breathing   Erythromycin Nausea And Vomiting, Nausea Only      Medication List  Advil 200 MG Caps Generic drug: Ibuprofen Take by mouth as needed.   esomeprazole 40 MG capsule Commonly known as: NEXIUM Take 1 capsule (40 mg total) by mouth every morning.   loratadine 10 MG tablet Commonly known as: CLARITIN Take 10 mg by mouth daily as needed for allergies.       Review of systems negative except as noted in HPI / PMHx or noted below:  Review of Systems  Constitutional: Negative.   HENT: Negative.   Eyes: Negative.   Respiratory: Negative.   Cardiovascular: Negative.   Gastrointestinal: Negative.   Genitourinary: Negative.   Musculoskeletal: Negative.   Skin:  Negative.   Neurological: Negative.   Endo/Heme/Allergies: Negative.   Psychiatric/Behavioral: Negative.     Family History  Problem Relation Age of Onset  . Hypertension Mother   . Stroke Mother   . Heart failure Mother   . Lymphoma Father   . COPD Father   . Barrett's esophagus Father     Social History   Socioeconomic History  . Marital status: Married    Spouse name: Not on file  . Number of children: Not on file  . Years of education: Not on file  . Highest education level: Not on file  Occupational History  . Not on file  Social Needs  . Financial resource strain: Not on file  . Food insecurity    Worry: Not on file    Inability: Not on file  . Transportation needs    Medical: Not on file    Non-medical: Not on file  Tobacco Use  . Smoking status: Never Smoker  . Smokeless tobacco: Never Used  Substance and Sexual Activity  . Alcohol use: Yes  . Drug use: Never  . Sexual activity: Not on file  Lifestyle  . Physical activity    Days per week: Not on file    Minutes per session: Not on file  . Stress: Not on file  Relationships  . Social Musicianconnections    Talks on phone: Not on file    Gets together: Not on file    Attends religious service: Not on file    Active member of club or organization: Not on file    Attends meetings of clubs or organizations: Not on file    Relationship status: Not on file  . Intimate partner violence    Fear of current or ex partner: Not on file    Emotionally abused: Not on file    Physically abused: Not on file    Forced sexual activity: Not on file  Other Topics Concern  . Not on file  Social History Narrative  . Not on file    Environmental and Social history  Lives in a house with a dry environment, a cat located inside the household, carpet in the bedroom, no plastic on the bed, no plastic on the pillow, no smoking ongoing with inside the household.  Objective:   Vitals:   06/15/19 1450  BP: (!) 148/86   Pulse: 88  Resp: 16  Temp: 98.3 F (36.8 C)  SpO2: 99%   Height: 5' 3.5" (161.3 cm) Weight: 116 lb 6.4 oz (52.8 kg)  Physical Exam Constitutional:      Appearance: She is not diaphoretic.  HENT:     Head: Normocephalic. No right periorbital erythema or left periorbital erythema.     Right Ear: Tympanic membrane, ear canal and external ear normal.     Left Ear: Tympanic membrane, ear canal  and external ear normal.     Nose: Nose normal. No mucosal edema or rhinorrhea.     Mouth/Throat:     Pharynx: Uvula midline. No oropharyngeal exudate.  Eyes:     General: Lids are normal.     Conjunctiva/sclera: Conjunctivae normal.     Pupils: Pupils are equal, round, and reactive to light.  Neck:     Thyroid: No thyromegaly.     Trachea: Trachea normal. No tracheal tenderness or tracheal deviation.  Cardiovascular:     Rate and Rhythm: Normal rate and regular rhythm.     Heart sounds: Normal heart sounds, S1 normal and S2 normal. No murmur.  Pulmonary:     Effort: Pulmonary effort is normal. No respiratory distress.     Breath sounds: Normal breath sounds. No stridor. No wheezing or rales.  Chest:     Chest wall: No tenderness.  Abdominal:     General: There is no distension.     Palpations: Abdomen is soft. There is no mass.     Tenderness: There is no abdominal tenderness. There is no guarding or rebound.  Musculoskeletal:        General: No tenderness.  Lymphadenopathy:     Head:     Right side of head: No tonsillar adenopathy.     Left side of head: No tonsillar adenopathy.     Cervical: No cervical adenopathy.  Skin:    Coloration: Skin is not pale.     Findings: No erythema or rash.     Nails: There is no clubbing.   Neurological:     Mental Status: She is alert.     Diagnostics: Allergy skin tests were not performed secondary to the recent use of an antihistamine.   Spirometry was performed and demonstrated an FEV1 of 2.56 @ 101 % of predicted. FEV1/FVC = 0.78.   Results of a chest x-ray obtained 12 May 2019 identifies the following:  Lung volumes are normal. No consolidative airspace disease. No pleural effusions. No pneumothorax. No pulmonary nodule or mass noted. Pulmonary vasculature and the cardiomediastinal silhouette are within normal limits.   Assessment and Plan:    1. Perennial allergic rhinitis   2. Gastroesophageal reflux disease, esophagitis presence not specified   3. Esophageal dysmotility   4. Atypical chest pain   5. Palpitations   6. Cholinergic urticaria   7. Hair loss     1.  Allergen avoidance measures?  2.  Treat and prevent reflux:   A.  Consolidate caffeine and chocolate consumption   B.  Nexium 40 mg tablet 1 time per day in a.m.  C.  Famotidine 40 mg tablet 1 time per day in p.m.  3.  If needed:   A.  Loratadine 10 mg - 1 tablet 1 time per day (dry eye?)  B.  OTC Systane gel drops at night  4.  Obtain endoscopy with Dr. Loreta Ave to look for eosinophilic esophagitis giving rise to esophageal dysmotility and chest pain  5.  Obtain a barium swallow for esophageal dysmotility  6.  Obtain blood-TSH, T4, TP for hair loss and palpitations  7.  Further evaluation?  8.  Return to clinic if needed  Cherisa has a collection of various symptoms that do not really add up to a singular disease.  It sounds as though she has cholinergic urticaria, which should be somewhat responsive to the use of an antihistamine prior to heat exposure, esophageal dysmotility and atypical chest pain and reflux, which should be  responsive to the antireflux therapy noted above and also requires further evaluation for possible eosinophilic esophagitis, and she has hair loss and palpitations, for which we will have her check thyroid function.  There is also a history of allergic rhinoconjunctivitis and dry eye syndrome which she attempted to address today as well.  I will be contacting her with the results of her blood test and barium swallow once  they are available for review.  Further evaluation and treatment will be based upon her response to the plan noted above and the results of her diagnostic testing.  Jiles Prows, MD Allergy / Immunology [facility]

## 2019-06-15 NOTE — Telephone Encounter (Signed)
Called patient. Informed her monitor should arrive on 06/17/2019. Patient verbally understood. No further questions.

## 2019-06-16 ENCOUNTER — Other Ambulatory Visit (INDEPENDENT_AMBULATORY_CARE_PROVIDER_SITE_OTHER): Payer: BC Managed Care – PPO

## 2019-06-16 ENCOUNTER — Encounter: Payer: Self-pay | Admitting: Allergy and Immunology

## 2019-06-16 DIAGNOSIS — R002 Palpitations: Secondary | ICD-10-CM

## 2019-06-16 LAB — THYROID PEROXIDASE ANTIBODY: Thyroperoxidase Ab SerPl-aCnc: <9 IU/mL (ref 0–34)

## 2019-06-16 LAB — TSH: TSH: 1.09 u[IU]/mL (ref 0.450–4.500)

## 2019-06-16 LAB — T4: T4, Total: 6.3 ug/dL (ref 4.5–12.0)

## 2019-06-17 ENCOUNTER — Inpatient Hospital Stay: Admission: RE | Admit: 2019-06-17 | Payer: BC Managed Care – PPO | Source: Ambulatory Visit

## 2019-06-24 ENCOUNTER — Ambulatory Visit
Admission: RE | Admit: 2019-06-24 | Discharge: 2019-06-24 | Disposition: A | Discharge status: Home / Self Care | Payer: BC Managed Care – PPO | Source: Ambulatory Visit | Attending: Allergy and Immunology | Admitting: Allergy and Immunology

## 2019-06-24 DIAGNOSIS — K224 Dyskinesia of esophagus: Secondary | ICD-10-CM

## 2019-06-24 DIAGNOSIS — K219 Gastro-esophageal reflux disease without esophagitis: Secondary | ICD-10-CM | POA: Diagnosis not present

## 2019-06-29 ENCOUNTER — Telehealth: Payer: Self-pay | Admitting: Allergy and Immunology

## 2019-06-29 ENCOUNTER — Telehealth: Payer: Self-pay | Admitting: Cardiology

## 2019-06-29 NOTE — Telephone Encounter (Signed)
Pt called to see if her test come back yet [phone]

## 2019-06-29 NOTE — Telephone Encounter (Signed)
Patient has a question regarding her CT call after 2pm.

## 2019-06-29 NOTE — Telephone Encounter (Signed)
Called patient back. She is concerned about radiation with her upcoming CT scan. She had a xray over a month ago and a barium swallow study recently she is wondering if this is too much radiation and wants to know if there is any other test we can do without radiation that will give same results. She is referring to Ct calcium score.

## 2019-06-29 NOTE — Telephone Encounter (Signed)
It is a very small dose of radiation, should have CAC

## 2019-06-30 NOTE — Telephone Encounter (Signed)
Spoke with patient and informed her of results.

## 2019-06-30 NOTE — Telephone Encounter (Signed)
Patient informed of Dr. Wendy Poet recommendation, she will go through with Ct. No further questions.

## 2019-07-02 ENCOUNTER — Ambulatory Visit (INDEPENDENT_AMBULATORY_CARE_PROVIDER_SITE_OTHER)
Admission: RE | Admit: 2019-07-02 | Discharge: 2019-07-02 | Disposition: A | Discharge status: Home / Self Care | Payer: Self-pay | Source: Ambulatory Visit | Attending: Cardiology | Admitting: Cardiology

## 2019-07-02 ENCOUNTER — Other Ambulatory Visit: Payer: Self-pay

## 2019-07-02 ENCOUNTER — Telehealth: Payer: Self-pay | Admitting: Cardiology

## 2019-07-02 DIAGNOSIS — R0789 Other chest pain: Secondary | ICD-10-CM

## 2019-07-02 NOTE — Telephone Encounter (Signed)
Patient informed of results.  

## 2019-07-02 NOTE — Telephone Encounter (Signed)
Patient called for result sof Heart monitor and CT that she just had done this am at [facility].Marland Kitchen

## 2019-07-07 ENCOUNTER — Encounter: Payer: Self-pay | Admitting: Cardiology

## 2019-07-07 ENCOUNTER — Other Ambulatory Visit: Payer: Self-pay

## 2019-07-07 ENCOUNTER — Ambulatory Visit (INDEPENDENT_AMBULATORY_CARE_PROVIDER_SITE_OTHER): Payer: BC Managed Care – PPO | Admitting: Cardiology

## 2019-07-07 VITALS — BP 140/70 | HR 92 | Ht 63.5 in | Wt 116.1 lb

## 2019-07-07 DIAGNOSIS — R0789 Other chest pain: Secondary | ICD-10-CM | POA: Diagnosis not present

## 2019-07-07 DIAGNOSIS — I471 Supraventricular tachycardia, unspecified: Secondary | ICD-10-CM | POA: Insufficient documentation

## 2019-07-07 DIAGNOSIS — E785 Hyperlipidemia, unspecified: Secondary | ICD-10-CM | POA: Insufficient documentation

## 2019-07-07 DIAGNOSIS — R079 Chest pain, unspecified: Secondary | ICD-10-CM | POA: Diagnosis not present

## 2019-07-07 HISTORY — DX: Supraventricular tachycardia, unspecified: I47.10

## 2019-07-07 HISTORY — DX: Supraventricular tachycardia: I47.1

## 2019-07-07 HISTORY — DX: Hyperlipidemia, unspecified: E78.5

## 2019-07-07 MED ORDER — METOPROLOL SUCCINATE ER 25 MG PO TB24: 25.0000 mg | ORAL_TABLET | Freq: Every day | ORAL | 1 refills | Status: DC

## 2019-07-07 NOTE — Patient Instructions (Signed)
Medication Instructions:  Your physician has recommended you make the following change in your medication:  START Metoprolol Succinate 25 mg 1 tablet daily  If you need a refill on your cardiac medications before your next appointment, please call your pharmacy.   Lab work: None ordered If you have labs (blood work) drawn today and your tests are completely normal, you will receive your results only by: Marland Kitchen MyChart Message (if you have MyChart) OR . A paper copy in the mail If you have any lab test that is abnormal or we need to change your treatment, we will call you to review the results.  Testing/Procedures: Your physician has requested that you have a lexiscan myoview. For further information please visit [email or web address]. Please follow instruction sheet, as given.   Follow-Up: At [facility], you and your health needs are our priority.  As part of our continuing mission to provide you with exceptional heart care, we have created designated Provider Care Teams.  These Care Teams include your primary Cardiologist (physician) and Advanced Practice Providers (APPs -  Physician Assistants and Nurse Practitioners) who all work together to provide you with the care you need, when you need it. You will need a follow up appointment in 6 weeks.  You may see Jenne Campus or another member of our [employer] Provider Team in [place]: Shirlee More, MD . Jyl Heinz, MD . Thomasenia Sales, DO . Laurann Montana, FNP

## 2019-07-07 NOTE — Progress Notes (Signed)
Cardiology Office Note:    Date:  07/07/2019   ID:  Linda Garrett, DOB April 30, 1961, MRN [id number]  PCP:  Daisy Floro, MD  Cardiologist:  Gypsy Balsam, MD    Referring MD: Daisy Floro, MD   Chief Complaint  Patient presents with  . 1 month follow up  Still having some chest pain  History of Present Illness:    Linda Garrett is a 58 y.o. female who presented to me because of atypical chest pain.  She does have pain all the time she graded sensation as 4 in scale up to 10 no relieving no aggravating factors.  From the very beginning have her rather doubt that this is coronary artery disease.  We elected to do calcium score calcium score came abnormal 49.  She is here to discuss this she also complained of having palpitations she wore monitor which showed some PVCs some of those symptomatic as well as supraventricular tachycardia.  We talked in length about the situation today.  I elected to start metoprolol succinate 25 mg daily.  She was somewhat reluctant but I explained to her was the rationale for that I also told her that supraventricular tachycardia is not deadly but just symptomatic in her case therefore need to be addressed.  Another issue is her calcium score not being normal and chest pain which I have to admit is very atypical but I think now with facing the situation we need to rule out obstructive coronary artery disease, therefore, I will ask her to have stress test.  Past Medical History:  Diagnosis Date  . Arthralgia of both hands 01/21/2018  . Cervicogenic headache 02/04/2019  . Chronic allergic rhinitis 12/14/2014  . Environmental allergies 07/24/2011  . Neck pain 02/04/2019  . Rheumatoid arthritis (HCC) 02/26/2018  . Trigger finger 02/26/2018    Past Surgical History:  Procedure Laterality Date  . NO PAST SURGERIES      Current Medications: Current Meds  Medication Sig  . esomeprazole (NEXIUM) 40 MG capsule Take 1 capsule (40 mg total) by mouth every  morning.  . Ibuprofen (ADVIL) 200 MG CAPS Take by mouth as needed.  . loratadine (CLARITIN) 10 MG tablet Take 10 mg by mouth daily as needed for allergies.     Allergies:   Tramadol and Erythromycin   Social History   Socioeconomic History  . Marital status: Married    Spouse name: Not on file  . Number of children: Not on file  . Years of education: Not on file  . Highest education level: Not on file  Occupational History  . Not on file  Social Needs  . Financial resource strain: Not on file  . Food insecurity    Worry: Not on file    Inability: Not on file  . Transportation needs    Medical: Not on file    Non-medical: Not on file  Tobacco Use  . Smoking status: Never Smoker  . Smokeless tobacco: Never Used  Substance and Sexual Activity  . Alcohol use: Yes  . Drug use: Never  . Sexual activity: Not on file  Lifestyle  . Physical activity    Days per week: Not on file    Minutes per session: Not on file  . Stress: Not on file  Relationships  . Social Musician on phone: Not on file    Gets together: Not on file    Attends religious service: Not on file  Active member of club or organization: Not on file    Attends meetings of clubs or organizations: Not on file    Relationship status: Not on file  Other Topics Concern  . Not on file  Social History Narrative  . Not on file     Family History: The patient's family history includes Barrett's esophagus in her father; COPD in her father; Heart failure in her mother; Hypertension in her mother; Lymphoma in her father; Stroke in her mother. ROS:   Please see the history of present illness.    All 14 point review of systems negative except as described per history of present illness  EKGs/Labs/Other Studies Reviewed:      Recent Labs: 06/15/2019: TSH 1.090  Recent Lipid Panel No results found for: CHOL, TRIG, HDL, CHOLHDL, VLDL, LDLCALC, LDLDIRECT  Physical Exam:    VS:  BP 140/70   Pulse 92    Ht 5' 3.5" (1.613 m)   Wt 116 lb 1.9 oz (52.7 kg)   SpO2 99%   BMI 20.25 kg/m     Wt Readings from Last 3 Encounters:  07/07/19 116 lb 1.9 oz (52.7 kg)  06/15/19 116 lb 6.4 oz (52.8 kg)  06/11/19 115 lb 1.9 oz (52.2 kg)     GEN:  Well nourished, well developed in no acute distress HEENT: Normal NECK: No JVD; No carotid bruits LYMPHATICS: No lymphadenopathy CARDIAC: RRR, no murmurs, no rubs, no gallops RESPIRATORY:  Clear to auscultation without rales, wheezing or rhonchi  ABDOMEN: Soft, non-tender, non-distended MUSCULOSKELETAL:  No edema; No deformity  SKIN: Warm and dry LOWER EXTREMITIES: no swelling NEUROLOGIC:  Alert and oriented x 3 PSYCHIATRIC:  Normal affect   ASSESSMENT:    1. Atypical chest pain   2. SVT (supraventricular tachycardia) ([place])   3. Dyslipidemia    PLAN:    In order of problems listed above:  1. Atypical chest pain stress test will be done to rule out obstructive disease however I doubt very much based on her symptomatology.  We will continue present management for now. 2. SVT I will ask her to start taking metoprolol succinate 25 mg daily 3. Dyslipidemia we will decide about aggressiveness of treatment based on results of her stress test.  I suspect she will benefit from statin.   Medication Adjustments/Labs and Tests Ordered: Current medicines are reviewed at length with the patient today.  Concerns regarding medicines are outlined above.  No orders of the defined types were placed in this encounter.  Medication changes: No orders of the defined types were placed in this encounter.   Signed, Park Liter, MD, [facility] 07/07/2019 9:54 AM    [place]

## 2019-07-08 ENCOUNTER — Telehealth (HOSPITAL_COMMUNITY): Payer: Self-pay

## 2019-07-08 ENCOUNTER — Telehealth: Payer: Self-pay | Admitting: *Deleted

## 2019-07-08 NOTE — Telephone Encounter (Signed)
Pt was put on Metoprolol and goes for miocardial perfusion. Must stop med before procedure. Should she wait and start med after procedure.

## 2019-07-08 NOTE — Telephone Encounter (Signed)
Spoke with the patient, she understood her instructions. She stated that she would be here for her test. Asked to call back with any questions.S.Jimmye Norman EMTP

## 2019-07-08 NOTE — Telephone Encounter (Signed)
Called patient informed her that she can go ahead a start her metoprolol as instructed yesterday and just hold 24 hours before her stress test. Patient verbally understands. No further questions.

## 2019-07-13 ENCOUNTER — Ambulatory Visit (HOSPITAL_COMMUNITY): Payer: BC Managed Care – PPO | Attending: Cardiology

## 2019-07-13 ENCOUNTER — Other Ambulatory Visit: Payer: Self-pay

## 2019-07-13 DIAGNOSIS — R079 Chest pain, unspecified: Secondary | ICD-10-CM | POA: Insufficient documentation

## 2019-07-13 LAB — MYOCARDIAL PERFUSION IMAGING
LV dias vol: 59 mL (ref 46–106)
LV sys vol: 24 mL
Peak HR: 115 {beats}/min
Rest HR: 71 {beats}/min
SDS: 5
SRS: 1
SSS: 6
TID: 1.04

## 2019-07-13 MED ORDER — TECHNETIUM TC 99M TETROFOSMIN IV KIT
30.4000 | PACK | Freq: Once | INTRAVENOUS | Status: AC | PRN
  Administered 2019-07-13: 30.4 via INTRAVENOUS
  Filled 2019-07-13: qty 31

## 2019-07-13 MED ORDER — REGADENOSON 0.4 MG/5ML IV SOLN
0.4000 mg | Freq: Once | INTRAVENOUS | Status: AC
  Administered 2019-07-13: 0.4 mg via INTRAVENOUS

## 2019-07-13 MED ORDER — TECHNETIUM TC 99M TETROFOSMIN IV KIT
10.1000 | PACK | Freq: Once | INTRAVENOUS | Status: AC | PRN
  Administered 2019-07-13: 10.1 via INTRAVENOUS
  Filled 2019-07-13: qty 11

## 2019-08-02 ENCOUNTER — Telehealth: Payer: Self-pay | Admitting: Cardiology

## 2019-08-02 NOTE — Telephone Encounter (Signed)
Patient called back. She reports she still has some chest tightness, and is unable to sleep at night since starting metoprolol succinate 25 mg daily. She feels light headed at times. She finds herself waking up at night feeling like she can't breath. These things were going on before she started the medication however they are not getting any better and she is concerned. Her blood pressure has stayed around 115/68 but she is worried the medication is making her symptoms worse possibly. I will consult with Dr. Agustin Cree and follow up with the patient.

## 2019-08-02 NOTE — Telephone Encounter (Signed)
Left message for patient to return call.

## 2019-08-02 NOTE — Telephone Encounter (Signed)
Please return call to discuss metoprolol

## 2019-08-03 MED ORDER — DILTIAZEM HCL ER COATED BEADS 120 MG PO CP24: 120.0000 mg | ORAL_CAPSULE | Freq: Every day | ORAL | 0 refills | Status: DC

## 2019-08-03 NOTE — Telephone Encounter (Signed)
Called patient informed her of lab results. Advised her to stop metoprolol for a few days and see how she feels. If she continues to feel bad after stopping metoprolol patient was instructed to let us know. Patient verbally understood. Otherwise patient will begin taking Cardizem 120  mg daily after she has been off metoprolol for a couple days. Patient verbally understands. No further questions.

## 2019-08-03 NOTE — Telephone Encounter (Signed)
Stop metoprolol, waait few days and see how she does, after few days we can start Cardiazem cd 120 mg po qd

## 2019-08-03 NOTE — Telephone Encounter (Signed)
Left message for patient to return call.

## 2019-08-25 ENCOUNTER — Ambulatory Visit: Payer: BC Managed Care – PPO | Admitting: Cardiology

## 2019-08-26 ENCOUNTER — Encounter: Payer: Self-pay | Admitting: Cardiology

## 2019-08-26 ENCOUNTER — Ambulatory Visit (INDEPENDENT_AMBULATORY_CARE_PROVIDER_SITE_OTHER): Payer: BC Managed Care – PPO | Admitting: Cardiology

## 2019-08-26 ENCOUNTER — Other Ambulatory Visit: Payer: Self-pay

## 2019-08-26 VITALS — BP 138/80 | HR 84 | Resp 12 | Ht 63.5 in | Wt 120.0 lb

## 2019-08-26 DIAGNOSIS — R002 Palpitations: Secondary | ICD-10-CM | POA: Diagnosis not present

## 2019-08-26 DIAGNOSIS — R0609 Other forms of dyspnea: Secondary | ICD-10-CM

## 2019-08-26 DIAGNOSIS — I471 Supraventricular tachycardia, unspecified: Secondary | ICD-10-CM

## 2019-08-26 DIAGNOSIS — I2510 Atherosclerotic heart disease of native coronary artery without angina pectoris: Secondary | ICD-10-CM

## 2019-08-26 DIAGNOSIS — R0600 Dyspnea, unspecified: Secondary | ICD-10-CM

## 2019-08-26 HISTORY — DX: Atherosclerotic heart disease of native coronary artery without angina pectoris: I25.10

## 2019-08-26 NOTE — Progress Notes (Signed)
Cardiology Office Note:    Date:  08/26/2019   ID:  Linda Garrett, DOB 03-Oct-1961, MRN [id number]  PCP:  Lawerance Cruel, MD  Cardiologist:  Jenne Campus, MD    Referring MD: Lawerance Cruel, MD   Chief Complaint  Patient presents with  . 6 week follow up  Doing well  History of Present Illness:    Linda Garrett is a 58 y.o. female with atypical chest pain, calcium score being done which showed 49 after that she had a stress test which was negative.  She comes today to my office to discuss those issues.  On top of that she also got supraventricular tachycardia with palpitations.  We try initially small dose of beta-blocker however she was not able to tolerate the medication therefore I sent prescription for Cardizem CD 120 however she did not take it yet.  Overall she says she is doing well described to have some atypical chest pain that happen typically at rest.  Again calcium score was 49 but stress test was negative.  She is getting ready to have a colonoscopy in December.  She is somewhat anxious about that.  She tried to be active try to cut down caffeinated drinks.  Overall seems to be doing well  Past Medical History:  Diagnosis Date  . Arthralgia of both hands 01/21/2018  . Cervicogenic headache 02/04/2019  . Chronic allergic rhinitis 12/14/2014  . Environmental allergies 07/24/2011  . Neck pain 02/04/2019  . Rheumatoid arthritis ([place]) 02/26/2018  . Trigger finger 02/26/2018    Past Surgical History:  Procedure Laterality Date  . NO PAST SURGERIES      Current Medications: No outpatient medications have been marked as taking for the 08/26/19 encounter (Office Visit) with Park Liter, MD.     Allergies:   Tramadol and Erythromycin   Social History   Socioeconomic History  . Marital status: Married    Spouse name: Not on file  . Number of children: Not on file  . Years of education: Not on file  . Highest education level: Not on file  Occupational  History  . Not on file  Social Needs  . Financial resource strain: Not on file  . Food insecurity    Worry: Not on file    Inability: Not on file  . Transportation needs    Medical: Not on file    Non-medical: Not on file  Tobacco Use  . Smoking status: Never Smoker  . Smokeless tobacco: Never Used  Substance and Sexual Activity  . Alcohol use: Yes  . Drug use: Never  . Sexual activity: Not on file  Lifestyle  . Physical activity    Days per week: Not on file    Minutes per session: Not on file  . Stress: Not on file  Relationships  . Social Herbalist on phone: Not on file    Gets together: Not on file    Attends religious service: Not on file    Active member of club or organization: Not on file    Attends meetings of clubs or organizations: Not on file    Relationship status: Not on file  Other Topics Concern  . Not on file  Social History Narrative  . Not on file     Family History: The patient's family history includes Barrett's esophagus in her father; COPD in her father; Heart failure in her mother; Hypertension in her mother; Lymphoma in her father; Stroke  in her mother. ROS:   Please see the history of present illness.    All 14 point review of systems negative except as described per history of present illness  EKGs/Labs/Other Studies Reviewed:      Recent Labs: 06/15/2019: TSH 1.090  Recent Lipid Panel No results found for: CHOL, TRIG, HDL, CHOLHDL, VLDL, LDLCALC, LDLDIRECT  Physical Exam:    VS:  BP 138/80   Pulse 84   Resp 12   Ht 5' 3.5" (1.613 m)   Wt 120 lb (54.4 kg)   BMI 20.92 kg/m     Wt Readings from Last 3 Encounters:  08/26/19 120 lb (54.4 kg)  07/13/19 116 lb (52.6 kg)  07/07/19 116 lb 1.9 oz (52.7 kg)     GEN:  Well nourished, well developed in no acute distress HEENT: Normal NECK: No JVD; No carotid bruits LYMPHATICS: No lymphadenopathy CARDIAC: RRR, no murmurs, no rubs, no gallops RESPIRATORY:  Clear to  auscultation without rales, wheezing or rhonchi  ABDOMEN: Soft, non-tender, non-distended MUSCULOSKELETAL:  No edema; No deformity  SKIN: Warm and dry LOWER EXTREMITIES: no swelling NEUROLOGIC:  Alert and oriented x 3 PSYCHIATRIC:  Normal affect   ASSESSMENT:    1. SVT (supraventricular tachycardia) (HCC)   2. Palpitations   3. Coronary artery disease involving native coronary artery of native heart without angina pectoris   4. Dyspnea on exertion    PLAN:    In order of problems listed above:  1. Supraventricular tachycardia.  I will try a small dose of calcium channel blocker see if she would be able to tolerate it.  She was intolerant to beta-blocker 2. Palpitations related to SVT. 3. Coronary disease stable from that point review of stress test negative calcium score 49 risk factors modifications, therefore, I will ask her to have her fasting lipid profile today and treat appropriately. 4. 4.  Dyspnea on exertion stable.   Medication Adjustments/Labs and Tests Ordered: Current medicines are reviewed at length with the patient today.  Concerns regarding medicines are outlined above.  No orders of the defined types were placed in this encounter.  Medication changes: No orders of the defined types were placed in this encounter.   Signed, Georgeanna Lea, MD, [facility] 08/26/2019 9:00 AM    [facility]

## 2019-08-26 NOTE — Patient Instructions (Signed)
Medication Instructions:  Your physician recommends that you continue on your current medications as directed. Please refer to the Current Medication list given to you today.  *If you need a refill on your cardiac medications before your next appointment, please call your pharmacy*  Lab Work: Your physician recommends that you return for lab work when you are fasting : lipids   If you have labs (blood work) drawn today and your tests are completely normal, you will receive your results only by: Marland Kitchen MyChart Message (if you have MyChart) OR . A paper copy in the mail If you have any lab test that is abnormal or we need to change your treatment, we will call you to review the results.  Testing/Procedures: None.   Follow-Up: At [facility], you and your health needs are our priority.  As part of our continuing mission to provide you with exceptional heart care, we have created designated Provider Care Teams.  These Care Teams include your primary Cardiologist (physician) and Advanced Practice Providers (APPs -  Physician Assistants and Nurse Practitioners) who all work together to provide you with the care you need, when you need it.  Your next appointment:   3 months  The format for your next appointment:   In Person  Provider:   You may see Dr. Agustin Cree or the following Advanced Practice Provider on your designated Care Team:    Laurann Montana, FNP   Other Instructions

## 2019-08-26 NOTE — Addendum Note (Signed)
Addended by: Ashok Norris on: 08/26/2019 09:23 AM   Modules accepted: Orders

## 2019-09-02 DIAGNOSIS — F4323 Adjustment disorder with mixed anxiety and depressed mood: Secondary | ICD-10-CM | POA: Diagnosis not present

## 2019-09-08 DIAGNOSIS — Z1211 Encounter for screening for malignant neoplasm of colon: Secondary | ICD-10-CM | POA: Diagnosis not present

## 2019-09-08 DIAGNOSIS — K2100 Gastro-esophageal reflux disease with esophagitis, without bleeding: Secondary | ICD-10-CM | POA: Diagnosis not present

## 2019-09-08 DIAGNOSIS — R1310 Dysphagia, unspecified: Secondary | ICD-10-CM | POA: Diagnosis not present

## 2019-09-08 DIAGNOSIS — K635 Polyp of colon: Secondary | ICD-10-CM | POA: Diagnosis not present

## 2019-09-08 DIAGNOSIS — D122 Benign neoplasm of ascending colon: Secondary | ICD-10-CM | POA: Diagnosis not present

## 2019-09-08 DIAGNOSIS — K219 Gastro-esophageal reflux disease without esophagitis: Secondary | ICD-10-CM | POA: Diagnosis not present

## 2019-10-07 DIAGNOSIS — F4323 Adjustment disorder with mixed anxiety and depressed mood: Secondary | ICD-10-CM | POA: Diagnosis not present

## 2019-10-25 ENCOUNTER — Other Ambulatory Visit: Payer: Self-pay | Admitting: Cardiology

## 2019-10-25 NOTE — Telephone Encounter (Signed)
*  STAT* If patient is at the pharmacy, call can be transferred to refill team.   1. Which medications need to be refilled? (please list name of each medication and dose if known) Ditalizem   2. Which pharmacy/location (including street and city if local pharmacy) is medication to be sent to?[facility] on [address] of [place]   3. Do they need a 30 day or 90 day supply? 30   Patient is scheduled for 11/18/2019

## 2019-10-26 ENCOUNTER — Other Ambulatory Visit: Payer: Self-pay

## 2019-10-26 MED ORDER — DILTIAZEM HCL ER COATED BEADS 120 MG PO CP24: 120.0000 mg | ORAL_CAPSULE | Freq: Every day | ORAL | 2 refills | Status: DC

## 2019-10-26 NOTE — Telephone Encounter (Signed)
Cardizem 120 mg daily refilled.

## 2019-10-26 NOTE — Addendum Note (Signed)
Addended by: Lita Mains on: 10/26/2019 03:52 PM   Modules accepted: Orders

## 2019-11-10 DIAGNOSIS — F4323 Adjustment disorder with mixed anxiety and depressed mood: Secondary | ICD-10-CM | POA: Diagnosis not present

## 2019-11-16 DIAGNOSIS — I2510 Atherosclerotic heart disease of native coronary artery without angina pectoris: Secondary | ICD-10-CM | POA: Diagnosis not present

## 2019-11-17 ENCOUNTER — Telehealth: Payer: Self-pay

## 2019-11-17 LAB — LIPID PANEL
Chol/HDL Ratio: 2.7 ratio (ref 0.0–4.4)
Cholesterol, Total: 204 mg/dL — ABNORMAL HIGH (ref 100–199)
HDL: 75 mg/dL (ref >39)
LDL Chol Calc (NIH): 112 mg/dL — ABNORMAL HIGH (ref 0–99)
Triglycerides: 96 mg/dL (ref 0–149)
VLDL Cholesterol Cal: 17 mg/dL (ref 5–40)

## 2019-11-17 NOTE — Telephone Encounter (Signed)
Called patient and lvm for a call back to go over lab results.

## 2019-11-18 ENCOUNTER — Other Ambulatory Visit: Payer: Self-pay

## 2019-11-18 ENCOUNTER — Ambulatory Visit (INDEPENDENT_AMBULATORY_CARE_PROVIDER_SITE_OTHER): Payer: BC Managed Care – PPO | Admitting: Cardiology

## 2019-11-18 ENCOUNTER — Encounter: Payer: Self-pay | Admitting: Cardiology

## 2019-11-18 VITALS — BP 130/82 | HR 86 | Ht 63.5 in | Wt 122.0 lb

## 2019-11-18 DIAGNOSIS — R002 Palpitations: Secondary | ICD-10-CM

## 2019-11-18 DIAGNOSIS — I2510 Atherosclerotic heart disease of native coronary artery without angina pectoris: Secondary | ICD-10-CM

## 2019-11-18 DIAGNOSIS — I471 Supraventricular tachycardia: Secondary | ICD-10-CM | POA: Diagnosis not present

## 2019-11-18 DIAGNOSIS — R0789 Other chest pain: Secondary | ICD-10-CM

## 2019-11-18 NOTE — Patient Instructions (Signed)
Medication Instructions:  .isntcu  *If you need a refill on your cardiac medications before your next appointment, please call your pharmacy*  Lab Work: None.  If you have labs (blood work) drawn today and your tests are completely normal, you will receive your results only by: Marland Kitchen MyChart Message (if you have MyChart) OR . A paper copy in the mail If you have any lab test that is abnormal or we need to change your treatment, we will call you to review the results.  Testing/Procedures: None.   Follow-Up: At [facility], you and your health needs are our priority.  As part of our continuing mission to provide you with exceptional heart care, we have created designated Provider Care Teams.  These Care Teams include your primary Cardiologist (physician) and Advanced Practice Providers (APPs -  Physician Assistants and Nurse Practitioners) who all work together to provide you with the care you need, when you need it.  Your next appointment:   4 month(s)  The format for your next appointment:   In Person  Provider:   You may see Dr. Bing Matter or the following Advanced Practice Provider on your designated Care Team:    Gillian Shields, FNP   Other Instructions

## 2019-11-18 NOTE — Progress Notes (Signed)
Cardiology Office Note:    Date:  11/18/2019   ID:  Linda Garrett, DOB 1961-09-19, MRN [id number]  PCP:  Lawerance Cruel, MD  Cardiologist:  Jenne Campus, MD    Referring MD: Lawerance Cruel, MD   Chief Complaint  Patient presents with  . Follow-up    3 MO FU     History of Present Illness:    Linda Garrett is a 59 y.o. female with past medical history significant for SVT, also she did have calcification of the coronary arteries noted on CT of the chest with calcium score 49.  After that she had stress test which was negative.  She comes today to my office for follow-up.  No complaint of having some palpitations though some much better initially when we switch her from beta-blocker to calcium channel blocker she felt great but now she still having some palpitations again.  We will talk about potential option of increasing her calcium channel blocker from 120 Cardizem to 180, she does not want to do it.  Another issue that we discussed is the fact that she does have some calcium calcifications of coronary arteries with a calcium score of 49.  With calculated [place] score which puts her at 3.1% a 10 years risk for coronary artery disease.  We talked about options for this situation including small dose of statin.  She does not want to do it she preferred to try this with diet as well as exercises.  Therefore, rest of the conversation was regarding that issue.  I gave her some hints about exercises as well as diet.  We talked about Mediterranean diet as well  Past Medical History:  Diagnosis Date  . Arthralgia of both hands 01/21/2018  . Cervicogenic headache 02/04/2019  . Chronic allergic rhinitis 12/14/2014  . Environmental allergies 07/24/2011  . Neck pain 02/04/2019  . Rheumatoid arthritis ([place]) 02/26/2018  . Trigger finger 02/26/2018    Past Surgical History:  Procedure Laterality Date  . NO PAST SURGERIES      Current Medications: No outpatient medications have been marked as  taking for the 11/18/19 encounter (Office Visit) with Park Liter, MD.     Allergies:   Tramadol and Erythromycin   Social History   Socioeconomic History  . Marital status: Married    Spouse name: Not on file  . Number of children: Not on file  . Years of education: Not on file  . Highest education level: Not on file  Occupational History  . Not on file  Tobacco Use  . Smoking status: Never Smoker  . Smokeless tobacco: Never Used  Substance and Sexual Activity  . Alcohol use: Yes  . Drug use: Never  . Sexual activity: Not on file  Other Topics Concern  . Not on file  Social History Narrative  . Not on file   Social Determinants of Health   Financial Resource Strain:   . Difficulty of Paying Living Expenses: Not on file  Food Insecurity:   . Worried About Charity fundraiser in the Last Year: Not on file  . Ran Out of Food in the Last Year: Not on file  Transportation Needs:   . Lack of Transportation (Medical): Not on file  . Lack of Transportation (Non-Medical): Not on file  Physical Activity:   . Days of Exercise per Week: Not on file  . Minutes of Exercise per Session: Not on file  Stress:   . Feeling of Stress :  Not on file  Social Connections:   . Frequency of Communication with Friends and Family: Not on file  . Frequency of Social Gatherings with Friends and Family: Not on file  . Attends Religious Services: Not on file  . Active Member of Clubs or Organizations: Not on file  . Attends Banker Meetings: Not on file  . Marital Status: Not on file     Family History: The patient's family history includes Barrett's esophagus in her father; COPD in her father; Heart failure in her mother; Hypertension in her mother; Lymphoma in her father; Stroke in her mother. ROS:   Please see the history of present illness.    All 14 point review of systems negative except as described per history of present illness  EKGs/Labs/Other Studies Reviewed:       Recent Labs: 06/15/2019: TSH 1.090  Recent Lipid Panel    Component Value Date/Time   CHOL 204 (H) 11/16/2019 0802   TRIG 96 11/16/2019 0802   HDL 75 11/16/2019 0802   CHOLHDL 2.7 11/16/2019 0802   LDLCALC 112 (H) 11/16/2019 0802    Physical Exam:    VS:  BP 130/82   Pulse 86   Ht 5' 3.5" (1.613 m)   Wt 122 lb (55.3 kg)   SpO2 98%   BMI 21.27 kg/m     Wt Readings from Last 3 Encounters:  11/18/19 122 lb (55.3 kg)  08/26/19 120 lb (54.4 kg)  07/13/19 116 lb (52.6 kg)     GEN:  Well nourished, well developed in no acute distress HEENT: Normal NECK: No JVD; No carotid bruits LYMPHATICS: No lymphadenopathy CARDIAC: RRR, no murmurs, no rubs, no gallops RESPIRATORY:  Clear to auscultation without rales, wheezing or rhonchi  ABDOMEN: Soft, non-tender, non-distended MUSCULOSKELETAL:  No edema; No deformity  SKIN: Warm and dry LOWER EXTREMITIES: no swelling NEUROLOGIC:  Alert and oriented x 3 PSYCHIATRIC:  Normal affect   ASSESSMENT:    1. SVT (supraventricular tachycardia) (HCC)   2. Coronary artery disease involving native coronary artery of native heart without angina pectoris   3. Atypical chest pain   4. Palpitations    PLAN:    In order of problems listed above:  1. Supraventricular tachycardia plan as outlined above.  I offer her increased dose of Cardizem prefers to stay at the same dose in spite of the fact that she does have some rare palpitations 2. Coronary artery disease: Calcification of coronary arteries by CT, mass 10-year risk is 3.1%.  I offered her statin she declined.  She prefer just diet and exercise which is good for now. 3. Atypical chest pain she described to have some heaviness in the chest all the time also complained of a some cough.  I suggested to talk to her primary care physician about potential issue with her lungs which can include asthma. 4. She exercise on a regular basis she goes to gym 3 times a week.  But she admits that she  does not push herself too hard.   Medication Adjustments/Labs and Tests Ordered: Current medicines are reviewed at length with the patient today.  Concerns regarding medicines are outlined above.  No orders of the defined types were placed in this encounter.  Medication changes: No orders of the defined types were placed in this encounter.   Signed, Georgeanna Lea, MD, [facility] 11/18/2019 8:40 AM     [facility]

## 2019-11-22 DIAGNOSIS — F4323 Adjustment disorder with mixed anxiety and depressed mood: Secondary | ICD-10-CM | POA: Diagnosis not present

## 2019-12-28 DIAGNOSIS — F4323 Adjustment disorder with mixed anxiety and depressed mood: Secondary | ICD-10-CM | POA: Diagnosis not present

## 2020-01-05 DIAGNOSIS — F4323 Adjustment disorder with mixed anxiety and depressed mood: Secondary | ICD-10-CM | POA: Diagnosis not present

## 2020-01-22 DIAGNOSIS — S8992XA Unspecified injury of left lower leg, initial encounter: Secondary | ICD-10-CM | POA: Diagnosis not present

## 2020-01-22 DIAGNOSIS — Z043 Encounter for examination and observation following other accident: Secondary | ICD-10-CM | POA: Diagnosis not present

## 2020-01-25 DIAGNOSIS — M79602 Pain in left arm: Secondary | ICD-10-CM | POA: Diagnosis not present

## 2020-02-06 DIAGNOSIS — R5383 Other fatigue: Secondary | ICD-10-CM | POA: Diagnosis not present

## 2020-02-06 DIAGNOSIS — R0989 Other specified symptoms and signs involving the circulatory and respiratory systems: Secondary | ICD-10-CM | POA: Diagnosis not present

## 2020-02-06 DIAGNOSIS — R05 Cough: Secondary | ICD-10-CM | POA: Diagnosis not present

## 2020-02-06 DIAGNOSIS — U071 COVID-19: Secondary | ICD-10-CM | POA: Diagnosis not present

## 2020-02-08 DIAGNOSIS — I2510 Atherosclerotic heart disease of native coronary artery without angina pectoris: Secondary | ICD-10-CM | POA: Diagnosis not present

## 2020-02-08 DIAGNOSIS — J209 Acute bronchitis, unspecified: Secondary | ICD-10-CM | POA: Diagnosis not present

## 2020-02-08 DIAGNOSIS — R000 Tachycardia, unspecified: Secondary | ICD-10-CM | POA: Diagnosis not present

## 2020-02-08 DIAGNOSIS — J069 Acute upper respiratory infection, unspecified: Secondary | ICD-10-CM | POA: Diagnosis not present

## 2020-02-08 DIAGNOSIS — Z20822 Contact with and (suspected) exposure to covid-19: Secondary | ICD-10-CM | POA: Diagnosis not present

## 2020-02-10 DIAGNOSIS — J40 Bronchitis, not specified as acute or chronic: Secondary | ICD-10-CM | POA: Diagnosis not present

## 2020-02-10 DIAGNOSIS — R000 Tachycardia, unspecified: Secondary | ICD-10-CM | POA: Diagnosis not present

## 2020-02-10 DIAGNOSIS — H02849 Edema of unspecified eye, unspecified eyelid: Secondary | ICD-10-CM | POA: Diagnosis not present

## 2020-02-10 DIAGNOSIS — J302 Other seasonal allergic rhinitis: Secondary | ICD-10-CM | POA: Diagnosis not present

## 2020-02-12 ENCOUNTER — Encounter: Payer: Self-pay | Admitting: Cardiology

## 2020-02-16 DIAGNOSIS — J209 Acute bronchitis, unspecified: Secondary | ICD-10-CM | POA: Diagnosis not present

## 2020-02-29 ENCOUNTER — Other Ambulatory Visit: Payer: Self-pay | Admitting: Family Medicine

## 2020-02-29 DIAGNOSIS — Z1231 Encounter for screening mammogram for malignant neoplasm of breast: Secondary | ICD-10-CM

## 2020-03-01 ENCOUNTER — Telehealth: Payer: Self-pay | Admitting: Cardiology

## 2020-03-01 MED ORDER — DILTIAZEM HCL ER COATED BEADS 120 MG PO CP24: 120.0000 mg | ORAL_CAPSULE | Freq: Every day | ORAL | 2 refills | Status: DC

## 2020-03-01 NOTE — Telephone Encounter (Signed)
Refill sent in per request.  

## 2020-03-01 NOTE — Telephone Encounter (Signed)
New message   Patient needs a new prescription for diltiazem (CARDIZEM CD) 120 MG 24 hr capsule sent to [id number] - [place], [place] - [address] [facility] [address]

## 2020-03-07 ENCOUNTER — Other Ambulatory Visit: Payer: Self-pay

## 2020-03-07 ENCOUNTER — Ambulatory Visit: Payer: BC Managed Care – PPO | Admitting: Cardiology

## 2020-03-08 ENCOUNTER — Other Ambulatory Visit: Payer: Self-pay

## 2020-03-08 ENCOUNTER — Ambulatory Visit: Payer: BC Managed Care – PPO | Admitting: Cardiology

## 2020-03-08 ENCOUNTER — Encounter: Payer: Self-pay | Admitting: Cardiology

## 2020-03-08 VITALS — BP 130/80 | HR 96 | Ht 63.0 in | Wt 118.0 lb

## 2020-03-08 DIAGNOSIS — I7121 Aneurysm of the ascending aorta, without rupture: Secondary | ICD-10-CM | POA: Insufficient documentation

## 2020-03-08 DIAGNOSIS — I2510 Atherosclerotic heart disease of native coronary artery without angina pectoris: Secondary | ICD-10-CM | POA: Diagnosis not present

## 2020-03-08 DIAGNOSIS — I471 Supraventricular tachycardia: Secondary | ICD-10-CM

## 2020-03-08 DIAGNOSIS — I712 Thoracic aortic aneurysm, without rupture: Secondary | ICD-10-CM | POA: Diagnosis not present

## 2020-03-08 DIAGNOSIS — R002 Palpitations: Secondary | ICD-10-CM | POA: Diagnosis not present

## 2020-03-08 HISTORY — DX: Aneurysm of the ascending aorta, without rupture: I71.21

## 2020-03-08 HISTORY — DX: Thoracic aortic aneurysm, without rupture: I71.2

## 2020-03-08 NOTE — Progress Notes (Signed)
Cardiology Office Note:    Date:  03/08/2020   ID:  Linda Garrett, DOB 08-23-1961, MRN [id number]  PCP:  Daisy Floro, MD  Cardiologist:  Gypsy Balsam, MD    Referring MD: Daisy Floro, MD   Chief Complaint  Patient presents with  . Follow-up  I am doing fine  History of Present Illness:    Linda Garrett is a 59 y.o. female with paroxysmal supraventricular tachycardia successfully suppressed with small dose of calcium channel blocker: Also have ascending aortic aneurysm last measurement in September 2020 showing 39 mm, calcium score has been elevated she did have stress test done which was negative.  Also does have mild dyslipidemia however her calculated risk for coronary event within the next 10 years was only 3.1.  Therefore, medical therapy for now.  Overall she is doing well she was sick with some bronchitis treated with antibiotic as well as steroids when she was taking steroids she had much more palpitations but nothing seems to be quieting down.  Past Medical History:  Diagnosis Date  . Arthralgia of both hands 01/21/2018  . Atypical chest pain 06/11/2019  . Cervicogenic headache 02/04/2019  . Chronic allergic rhinitis 12/14/2014  . Coronary artery disease positive calcium score of 49 08/26/2019  . Dyslipidemia 07/07/2019  . Dyspnea on exertion 06/11/2019  . Environmental allergies 07/24/2011  . Neck pain 02/04/2019  . Palpitations 06/11/2019  . Rheumatoid arthritis (HCC) 02/26/2018  . SVT (supraventricular tachycardia) (HCC) 07/07/2019  . Trigger finger 02/26/2018    Past Surgical History:  Procedure Laterality Date  . NO PAST SURGERIES      Current Medications: Current Meds  Medication Sig  . diltiazem (CARDIZEM CD) 120 MG 24 hr capsule Take 1 capsule (120 mg total) by mouth daily.  Marland Kitchen levalbuterol (XOPENEX HFA) 45 MCG/ACT inhaler Inhale 2 puffs into the lungs every 6 (six) hours as needed.     Allergies:   Tramadol and Erythromycin   Social History    Socioeconomic History  . Marital status: Married    Spouse name: Not on file  . Number of children: Not on file  . Years of education: Not on file  . Highest education level: Not on file  Occupational History  . Not on file  Tobacco Use  . Smoking status: Never Smoker  . Smokeless tobacco: Never Used  Substance and Sexual Activity  . Alcohol use: Yes  . Drug use: Never  . Sexual activity: Not on file  Other Topics Concern  . Not on file  Social History Narrative  . Not on file   Social Determinants of Health   Financial Resource Strain:   . Difficulty of Paying Living Expenses:   Food Insecurity:   . Worried About Programme researcher, broadcasting/film/video in the Last Year:   . Barista in the Last Year:   Transportation Needs:   . Freight forwarder (Medical):   Marland Kitchen Lack of Transportation (Non-Medical):   Physical Activity:   . Days of Exercise per Week:   . Minutes of Exercise per Session:   Stress:   . Feeling of Stress :   Social Connections:   . Frequency of Communication with Friends and Family:   . Frequency of Social Gatherings with Friends and Family:   . Attends Religious Services:   . Active Member of Clubs or Organizations:   . Attends Banker Meetings:   Marland Kitchen Marital Status:      Family  History: The patient's family history includes Barrett's esophagus in her father; COPD in her father; Heart failure in her mother; Hypertension in her mother; Lymphoma in her father; Stroke in her mother. ROS:   Please see the history of present illness.    All 14 point review of systems negative except as described per history of present illness  EKGs/Labs/Other Studies Reviewed:      Recent Labs: 06/15/2019: TSH 1.090  Recent Lipid Panel    Component Value Date/Time   CHOL 204 (H) 11/16/2019 0802   TRIG 96 11/16/2019 0802   HDL 75 11/16/2019 0802   CHOLHDL 2.7 11/16/2019 0802   LDLCALC 112 (H) 11/16/2019 0802    Physical Exam:    VS:  BP 130/80   Pulse  96   Ht 5\' 3"  (1.6 m)   Wt 118 lb (53.5 kg)   SpO2 98%   BMI 20.90 kg/m     Wt Readings from Last 3 Encounters:  03/08/20 118 lb (53.5 kg)  11/18/19 122 lb (55.3 kg)  08/26/19 120 lb (54.4 kg)     GEN:  Well nourished, well developed in no acute distress HEENT: Normal NECK: No JVD; No carotid bruits LYMPHATICS: No lymphadenopathy CARDIAC: RRR, no murmurs, no rubs, no gallops RESPIRATORY:  Clear to auscultation without rales, wheezing or rhonchi  ABDOMEN: Soft, non-tender, non-distended MUSCULOSKELETAL:  No edema; No deformity  SKIN: Warm and dry LOWER EXTREMITIES: no swelling NEUROLOGIC:  Alert and oriented x 3 PSYCHIATRIC:  Normal affect   ASSESSMENT:    1. SVT (supraventricular tachycardia) ([place])   2. Coronary artery disease involving native coronary artery of native heart without angina pectoris   3. Palpitations   4. Ascending aortic aneurysm (HCC)    PLAN:    In order of problems listed above:  1. Supraventricular tachycardia successfully suppressed with calcium channel blocker which will continue. 2. Coronary artery disease with a high calcium score but negative stress test risk factor modification is the most important part of management here.  We talked about healthy lifestyle exercises on the regular basis because of calculated risk is only 3.1% for 10 years no statin yet. 3. Palpitations now quiet down after she stops to rest. 4. Ascending arctic aneurysm.  I will schedule her to have another CT IN September and the purpose of this is to make sure we look at to issues aneurysm as well as pulmonary nodule that she has.   Medication Adjustments/Labs and Tests Ordered: Current medicines are reviewed at length with the patient today.  Concerns regarding medicines are outlined above.  No orders of the defined types were placed in this encounter.  Medication changes: No orders of the defined types were placed in this encounter.   Signed, Park Liter, MD,  [facility] 03/08/2020 10:27 AM    [place]

## 2020-03-08 NOTE — Patient Instructions (Signed)
Medication Instructions:  Your physician recommends that you continue on your current medications as directed. Please refer to the Current Medication list given to you today. u *If you need a refill on your cardiac medications before your next appointment, please call your pharmacy*   Lab Work: None.   If you have labs (blood work) drawn today and your tests are completely normal, you will receive your results only by: Marland Kitchen MyChart Message (if you have MyChart) OR . A paper copy in the mail If you have any lab test that is abnormal or we need to change your treatment, we will call you to review the results.   Testing/Procedures: None.    Follow-Up: At [facility], you and your health needs are our priority.  As part of our continuing mission to provide you with exceptional heart care, we have created designated Provider Care Teams.  These Care Teams include your primary Cardiologist (physician) and Advanced Practice Providers (APPs -  Physician Assistants and Nurse Practitioners) who all work together to provide you with the care you need, when you need it.  We recommend signing up for the patient portal called "MyChart".  Sign up information is provided on this After Visit Summary.  MyChart is used to connect with patients for Virtual Visits (Telemedicine).  Patients are able to view lab/test results, encounter notes, upcoming appointments, etc.  Non-urgent messages can be sent to your provider as well.   To learn more about what you can do with MyChart, go to [email or web address].    Your next appointment:   6 month(s)  The format for your next appointment:   In Person  Provider:   Gypsy Balsam, MD   Other Instructions  Call us in September to get scheduled for CT.

## 2020-03-30 DIAGNOSIS — F4323 Adjustment disorder with mixed anxiety and depressed mood: Secondary | ICD-10-CM | POA: Diagnosis not present

## 2020-05-09 DIAGNOSIS — F4323 Adjustment disorder with mixed anxiety and depressed mood: Secondary | ICD-10-CM | POA: Diagnosis not present

## 2020-05-23 DIAGNOSIS — F4323 Adjustment disorder with mixed anxiety and depressed mood: Secondary | ICD-10-CM | POA: Diagnosis not present

## 2020-05-29 ENCOUNTER — Other Ambulatory Visit: Payer: Self-pay | Admitting: Cardiology

## 2020-06-13 DIAGNOSIS — Z0000 Encounter for general adult medical examination without abnormal findings: Secondary | ICD-10-CM | POA: Diagnosis not present

## 2020-06-13 DIAGNOSIS — Z13220 Encounter for screening for lipoid disorders: Secondary | ICD-10-CM | POA: Diagnosis not present

## 2020-06-13 DIAGNOSIS — M2550 Pain in unspecified joint: Secondary | ICD-10-CM | POA: Diagnosis not present

## 2020-06-20 DIAGNOSIS — Z23 Encounter for immunization: Secondary | ICD-10-CM | POA: Diagnosis not present

## 2020-06-20 DIAGNOSIS — M2560 Stiffness of unspecified joint, not elsewhere classified: Secondary | ICD-10-CM | POA: Diagnosis not present

## 2020-06-20 DIAGNOSIS — Z0000 Encounter for general adult medical examination without abnormal findings: Secondary | ICD-10-CM | POA: Diagnosis not present

## 2020-06-20 DIAGNOSIS — L659 Nonscarring hair loss, unspecified: Secondary | ICD-10-CM | POA: Diagnosis not present

## 2020-06-20 DIAGNOSIS — F5101 Primary insomnia: Secondary | ICD-10-CM | POA: Diagnosis not present

## 2020-06-28 DIAGNOSIS — F4323 Adjustment disorder with mixed anxiety and depressed mood: Secondary | ICD-10-CM | POA: Diagnosis not present

## 2020-07-25 ENCOUNTER — Other Ambulatory Visit: Payer: Self-pay

## 2020-07-25 ENCOUNTER — Encounter: Payer: Self-pay | Admitting: Allergy and Immunology

## 2020-07-25 ENCOUNTER — Ambulatory Visit: Payer: BC Managed Care – PPO | Admitting: Allergy and Immunology

## 2020-07-25 VITALS — BP 92/68 | HR 84 | Temp 97.9°F | Resp 18 | Ht 63.0 in | Wt 121.2 lb

## 2020-07-25 DIAGNOSIS — T7800XA Anaphylactic reaction due to unspecified food, initial encounter: Secondary | ICD-10-CM

## 2020-07-25 DIAGNOSIS — J3089 Other allergic rhinitis: Secondary | ICD-10-CM

## 2020-07-25 DIAGNOSIS — T781XXD Other adverse food reactions, not elsewhere classified, subsequent encounter: Secondary | ICD-10-CM | POA: Diagnosis not present

## 2020-07-25 DIAGNOSIS — J301 Allergic rhinitis due to pollen: Secondary | ICD-10-CM | POA: Diagnosis not present

## 2020-07-25 DIAGNOSIS — L505 Cholinergic urticaria: Secondary | ICD-10-CM

## 2020-07-25 MED ORDER — MONTELUKAST SODIUM 10 MG PO TABS: 10.0000 mg | ORAL_TABLET | Freq: Every day | ORAL | 5 refills | Status: DC

## 2020-07-25 MED ORDER — EPINEPHRINE 0.3 MG/0.3ML IJ SOAJ: 0.3000 mg | Freq: Once | INTRAMUSCULAR | 1 refills | Status: AC

## 2020-07-25 NOTE — Patient Instructions (Addendum)
  1.  Allergen avoidance measures  2. Treat and prevent over active immune system:   A. Montelukast 10 mg - 1 tablet 1 time per day  B. OTC Rhinocort / Budesonide - 1 spray each nostril 1 time per day  2.  Treat and prevent reflux:   A.  Aim to always consolidate caffeine and chocolate consumption   3. If needed:   A.  Cetirizine/Zyrtec 10 mg - 1 tablet 1 time per day (dry eye?)  B.  OTC Systane eyedrops multiple times a day  C. OTC Pataday - 1 drop each eye 1 time per day  D. Auvi-Q 0.3, benadryl, MD/ER evaluation for allergic reaction  4.  Immunotherapy?  5. Blood - alpha-gal panel, tryptase, milk panel w/R, egg panel w/R  6.  Return to clinic in one year or earlier if problem  7.  Obtain fall flu vaccine

## 2020-07-25 NOTE — Progress Notes (Signed)
[place] - [place] - [place] - [place] - [place]   Follow-up Note  Referring Provider: Daisy Floro, MD Primary Provider: Daisy Floro, MD Date of Office Visit: 07/25/2020  Subjective:   Linda Garrett (DOB: [birth year]) is a 59 y.o. female who [facility] in re-evaluation of the following:  HPI: [name] returns to this clinic in reevaluation of multiple issues addressed during her initial evaluation of 15 June 2019 which was her last visit to this clinic.   During her last evaluation she had an issue with atypical chest pain and symptoms consistent with LPR and she has dramatically consolidated her caffeine consumption and now only has chocolate as her only form of caffeine and basically has resolved all of her atypical chest pain and her burping and belching and regurgitation and throat issues.   We did obtain a barium swallow in investigation of her complaints during her initial evaluation and that study did identify very significant reflux and some very mild esophageal dysmotility. She apparently had an upper endoscopy performed by Dr. Loreta Ave but she does not know the results of an esophageal biopsy.   During her last evaluation she described a history consistent with cholinergic urticaria and she still finds this to be active.  She states that she is allergic to her sweat.  She understands this issue quite well and sometimes she will take a Zyrtec which helps.  She has sniffing and snorting and sneezing and nasal congestion which has been a longstanding issue.  She has had skin test performed in the distant past which identified very significant pollen allergy.  She will occasionally take a Zyrtec about twice a week but she limits her Zyrtec use because of her significant dry eye syndrome.  She does not use any nasal steroids.  Her big issue today is the fact that she thinks that she may have food allergies.  For a prolonged  period in time she has noticed that she will develop these spells of unrelenting sneezing 40 times in a row along with ear and throat itching and nasal swelling and periorbital swelling on a pretty frequent basis.  Sometimes this can occur multiple times per week and sometimes multiple times per month.  There is not really an obvious trigger giving rise to this issue but it appears to occur after she eats.  She thinks that fruit may be a trigger for she eats large amounts of food she develops this issue but if she eats small amounts of food she usually does not develop this issue.  Allergies as of 07/25/2020      Reactions   Tramadol Shortness Of Breath   Felt like she was going to stop breathing   Erythromycin Nausea And Vomiting, Nausea Only      Medication List      diltiazem 120 MG 24 hr capsule Commonly known as: CARDIZEM CD TAKE 1 CAPSULE(120 MG) BY MOUTH DAILY   EPINEPHrine 0.3 mg/0.3 mL Soaj injection Commonly known as: Auvi-Q Inject 0.3 mg into the muscle once for 1 dose. As directed for life-threatening allergic reactions Started by: Steadman Prosperi Claudia Pollock, MD   levalbuterol 45 MCG/ACT inhaler Commonly known as: XOPENEX HFA Inhale 2 puffs into the lungs every 6 (six) hours as needed.   montelukast 10 MG tablet Commonly known as: SINGULAIR Take 1 tablet (10 mg total) by mouth at bedtime. Started by: Jessica Priest, MD   zolpidem 10 MG tablet Commonly  known as: AMBIEN Take 5-10 mg by mouth at bedtime as needed.       Past Medical History:  Diagnosis Date  . Arthralgia of both hands 01/21/2018  . Atypical chest pain 06/11/2019  . Cervicogenic headache 02/04/2019  . Chronic allergic rhinitis 12/14/2014  . Coronary artery disease positive calcium score of 49 08/26/2019  . Dyslipidemia 07/07/2019  . Dyspnea on exertion 06/11/2019  . Environmental allergies 07/24/2011  . Neck pain 02/04/2019  . Palpitations 06/11/2019  . Rheumatoid arthritis (HCC) 02/26/2018  . SVT (supraventricular  tachycardia) (HCC) 07/07/2019  . Trigger finger 02/26/2018    Past Surgical History:  Procedure Laterality Date  . NO PAST SURGERIES      Review of systems negative except as noted in HPI / PMHx or noted below:  Review of Systems  Constitutional: Negative.   HENT: Negative.   Eyes: Negative.   Respiratory: Negative.   Cardiovascular: Negative.   Gastrointestinal: Negative.   Genitourinary: Negative.   Musculoskeletal: Negative.   Skin: Negative.   Neurological: Negative.   Endo/Heme/Allergies: Negative.   Psychiatric/Behavioral: Negative.      Objective:   Vitals:   07/25/20 1002  BP: 92/68  Pulse: 84  Resp: 18  Temp: 97.9 F (36.6 C)  SpO2: 98%   Height: 5\' 3"  (160 cm)  Weight: 121 lb 3.2 oz (55 kg)   Physical Exam Constitutional:      Appearance: She is not diaphoretic.  HENT:     Head: Normocephalic.     Right Ear: Tympanic membrane, ear canal and external ear normal.     Left Ear: Tympanic membrane, ear canal and external ear normal.     Nose: Nose normal. No mucosal edema or rhinorrhea.     Mouth/Throat:     Pharynx: Uvula midline. No oropharyngeal exudate.  Eyes:     Conjunctiva/sclera: Conjunctivae normal.  Neck:     Thyroid: No thyromegaly.     Trachea: Trachea normal. No tracheal tenderness or tracheal deviation.  Cardiovascular:     Rate and Rhythm: Normal rate and regular rhythm.     Heart sounds: Normal heart sounds, S1 normal and S2 normal. No murmur heard.   Pulmonary:     Effort: No respiratory distress.     Breath sounds: Normal breath sounds. No stridor. No wheezing or rales.  Lymphadenopathy:     Head:     Right side of head: No tonsillar adenopathy.     Left side of head: No tonsillar adenopathy.     Cervical: No cervical adenopathy.  Skin:    Findings: No erythema or rash.     Nails: There is no clubbing.  Neurological:     Mental Status: She is alert.     Diagnostics: Allergy skin testing was performed.  She demonstrated  hypersensitivity to egg, casein, pineapple.  Aeroallergens skin testing was not performed.  Assessment and Plan:   1. Pollen-food allergy, subsequent encounter   2. Allergy with anaphylaxis due to food   3. Perennial allergic rhinitis   4. Seasonal allergic rhinitis due to pollen   5. Cholinergic urticaria     1.  Allergen avoidance measures  2. Treat and prevent over active immune system:   A. Montelukast 10 mg - 1 tablet 1 time per day  B. OTC Rhinocort / Budesonide - 1 spray each nostril 1 time per day  2.  Treat and prevent reflux:   A.  Aim to always consolidate caffeine and chocolate consumption   3.  If needed:   A.  Cetirizine/Zyrtec 10 mg - 1 tablet 1 time per day (dry eye?)  B.  OTC Systane eyedrops multiple times a day  C. OTC Pataday - 1 drop each eye 1 time per day  D. Auvi-Q 0.3, benadryl, MD/ER evaluation for allergic reaction  4.  Immunotherapy?  5. Blood - alpha-gal panel, tryptase, milk panel w/R, egg panel w/R  6.  Return to clinic in one year or earlier if problem  7.  Obtain fall flu vaccine  It appears that Lylith has a very atopic immune system and she appears to have oral allergy syndrome and occasionally slips over into some systemic symptomatology.  We will further evaluate her possible food hypersensitivity by checking an alpha gal panel and defining the extent of her milk and egg allergy.  To be complete we will check a tryptase as she does appear to be extremely atopic and this all may be secondary to mast cell overactivity.  I recommended that she consistently use some anti-inflammatory agents for her airway in the hope of alleviating her upper airway symptoms and also preventing her from developing more dry eye symptomatology when she uses an antihistamine.  If we can keep her away from using antihistamines that would be the best for her eye.  She would definitely be a candidate for immunotherapy if she would like to treat her oral allergy syndrome.   Utilizing a course of immunotherapy directed against pollen would modify this oral allergy syndrome.  We will keep in communication with each other by telephone discussing her response to this approach and the results of her diagnostic testing.  Laurette Schimke, MD Allergy / Immunology [facility]

## 2020-07-26 ENCOUNTER — Encounter: Payer: Self-pay | Admitting: Allergy and Immunology

## 2020-08-01 DIAGNOSIS — F4323 Adjustment disorder with mixed anxiety and depressed mood: Secondary | ICD-10-CM | POA: Diagnosis not present

## 2020-08-01 LAB — ALPHA-GAL PANEL
Alpha Gal IgE*: <0.1 kU/L (ref <0.10)
Beef (Bos spp) IgE: <0.1 kU/L (ref <0.35)
Class Interpretation: 0
Class Interpretation: 0
Class Interpretation: 0
Lamb/Mutton (Ovis spp) IgE: <0.1 kU/L (ref <0.35)
Pork (Sus spp) IgE: <0.1 kU/L (ref <0.35)

## 2020-08-01 LAB — IGE MILK W/ COMPONENT REFLEX: F002-IgE Milk: 0.25 kU/L — AB

## 2020-08-01 LAB — TRYPTASE: Tryptase: 4.7 ug/L (ref 2.2–13.2)

## 2020-08-01 LAB — IGE EGG WHITE W/COMPONENT RFLX: F001-IgE Egg White: <0.1 kU/L

## 2020-08-28 DIAGNOSIS — F4323 Adjustment disorder with mixed anxiety and depressed mood: Secondary | ICD-10-CM | POA: Diagnosis not present

## 2020-08-31 ENCOUNTER — Other Ambulatory Visit: Payer: Self-pay | Admitting: Family Medicine

## 2020-08-31 DIAGNOSIS — M810 Age-related osteoporosis without current pathological fracture: Secondary | ICD-10-CM

## 2020-09-25 DIAGNOSIS — M5386 Other specified dorsopathies, lumbar region: Secondary | ICD-10-CM | POA: Diagnosis not present

## 2020-09-25 DIAGNOSIS — M9905 Segmental and somatic dysfunction of pelvic region: Secondary | ICD-10-CM | POA: Diagnosis not present

## 2020-09-25 DIAGNOSIS — M9902 Segmental and somatic dysfunction of thoracic region: Secondary | ICD-10-CM | POA: Diagnosis not present

## 2020-09-25 DIAGNOSIS — M9903 Segmental and somatic dysfunction of lumbar region: Secondary | ICD-10-CM | POA: Diagnosis not present

## 2020-10-02 DIAGNOSIS — L578 Other skin changes due to chronic exposure to nonionizing radiation: Secondary | ICD-10-CM | POA: Diagnosis not present

## 2020-10-02 DIAGNOSIS — D229 Melanocytic nevi, unspecified: Secondary | ICD-10-CM | POA: Diagnosis not present

## 2020-10-02 DIAGNOSIS — M9902 Segmental and somatic dysfunction of thoracic region: Secondary | ICD-10-CM | POA: Diagnosis not present

## 2020-10-02 DIAGNOSIS — M5386 Other specified dorsopathies, lumbar region: Secondary | ICD-10-CM | POA: Diagnosis not present

## 2020-10-02 DIAGNOSIS — M9903 Segmental and somatic dysfunction of lumbar region: Secondary | ICD-10-CM | POA: Diagnosis not present

## 2020-10-02 DIAGNOSIS — L570 Actinic keratosis: Secondary | ICD-10-CM | POA: Diagnosis not present

## 2020-10-02 DIAGNOSIS — B078 Other viral warts: Secondary | ICD-10-CM | POA: Diagnosis not present

## 2020-10-02 DIAGNOSIS — M9905 Segmental and somatic dysfunction of pelvic region: Secondary | ICD-10-CM | POA: Diagnosis not present

## 2020-10-03 ENCOUNTER — Other Ambulatory Visit: Payer: Self-pay

## 2020-10-03 DIAGNOSIS — K219 Gastro-esophageal reflux disease without esophagitis: Secondary | ICD-10-CM

## 2020-10-03 DIAGNOSIS — H9312 Tinnitus, left ear: Secondary | ICD-10-CM

## 2020-10-03 DIAGNOSIS — F5101 Primary insomnia: Secondary | ICD-10-CM | POA: Insufficient documentation

## 2020-10-03 DIAGNOSIS — E559 Vitamin D deficiency, unspecified: Secondary | ICD-10-CM

## 2020-10-03 HISTORY — DX: Vitamin D deficiency, unspecified: E55.9

## 2020-10-03 HISTORY — DX: Primary insomnia: F51.01

## 2020-10-03 HISTORY — DX: Tinnitus, left ear: H93.12

## 2020-10-03 HISTORY — DX: Gastro-esophageal reflux disease without esophagitis: K21.9

## 2020-10-04 ENCOUNTER — Ambulatory Visit: Payer: BC Managed Care – PPO | Admitting: Cardiology

## 2020-10-04 ENCOUNTER — Other Ambulatory Visit: Payer: Self-pay

## 2020-10-04 ENCOUNTER — Encounter: Payer: Self-pay | Admitting: Cardiology

## 2020-10-04 VITALS — BP 124/84 | HR 68 | Ht 63.0 in | Wt 119.0 lb

## 2020-10-04 DIAGNOSIS — E785 Hyperlipidemia, unspecified: Secondary | ICD-10-CM

## 2020-10-04 DIAGNOSIS — I2510 Atherosclerotic heart disease of native coronary artery without angina pectoris: Secondary | ICD-10-CM

## 2020-10-04 DIAGNOSIS — R079 Chest pain, unspecified: Secondary | ICD-10-CM

## 2020-10-04 DIAGNOSIS — I712 Thoracic aortic aneurysm, without rupture: Secondary | ICD-10-CM

## 2020-10-04 DIAGNOSIS — I7121 Aneurysm of the ascending aorta, without rupture: Secondary | ICD-10-CM

## 2020-10-04 DIAGNOSIS — I471 Supraventricular tachycardia: Secondary | ICD-10-CM

## 2020-10-04 DIAGNOSIS — R002 Palpitations: Secondary | ICD-10-CM

## 2020-10-04 NOTE — Progress Notes (Signed)
Cardiology Office Note:    Date:  10/04/2020   ID:  Linda Garrett, DOB September 21, 1961, MRN [id number]  PCP:  Daisy Floro, MD  Cardiologist:  Gypsy Balsam, MD    Referring MD: Daisy Floro, MD   No chief complaint on file. Doing fine  History of Present Illness:    RYELEE Garrett is a 59 y.o. female with past medical history significant for supraventricular tachycardia, that is successfully suppressed with calcium channel blocker, essential hypertension which is controlled, ascending aortic aneurysm, calcium score 49.  She did have a stress test done after that which showed no evidence of ischemia.  She comes today 2 months for follow-up overall she is doing well.  Described to have some fatigue and tiredness but still try to exercise on the regular basis by walking she does have a treadmill and elliptical at home.  She does at least 5 times a week moderate intensity exercise one half an hour to 45 minutes.  Past Medical History:  Diagnosis Date  . Arthralgia of both hands 01/21/2018  . Ascending aortic aneurysm (HCC) 03/08/2020  . Atypical chest pain 06/11/2019  . Cervicogenic headache 02/04/2019  . Chronic allergic rhinitis 12/14/2014  . Coronary artery disease positive calcium score of 49 08/26/2019  . Dyslipidemia 07/07/2019  . Dyspnea on exertion 06/11/2019  . Environmental allergies 07/24/2011  . Gastroesophageal reflux disease 10/03/2020  . Neck pain 02/04/2019  . Palpitations 06/11/2019  . Primary insomnia 10/03/2020  . Rheumatoid arthritis (HCC) 02/26/2018  . Seasonal allergic rhinitis 12/14/2014  . SVT (supraventricular tachycardia) (HCC) 07/07/2019  . 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 Trigger finger 02/26/2018  . Vitamin D deficiency 10/03/2020    Past Surgical History:  Procedure Laterality Date  . NO PAST SURGERIES      Current Medications: Current Meds  Medication Sig  . cetirizine (ZYRTEC) 10 MG tablet Take 10 mg by mouth daily.  Marland Kitchen diltiazem (CARDIZEM CD)  120 MG 24 hr capsule TAKE 1 CAPSULE(120 MG) BY MOUTH DAILY  . diphenhydrAMINE (BENADRYL) 25 mg capsule Take 25 mg by mouth as needed.  . zolpidem (AMBIEN) 10 MG tablet Take 5-10 mg by mouth at bedtime as needed.     Allergies:   Tramadol, Tizanidine hcl, and Erythromycin   Social History   Socioeconomic History  . Marital status: Married    Spouse name: Not on file  . Number of children: Not on file  . Years of education: Not on file  . Highest education level: Not on file  Occupational History  . Not on file  Tobacco Use  . Smoking status: Never Smoker  . Smokeless tobacco: Never Used  Vaping Use  . Vaping Use: Never used  Substance and Sexual Activity  . Alcohol use: Yes  . Drug use: Never  . Sexual activity: Not on file  Other Topics Concern  . Not on file  Social History Narrative  . Not on file   Social Determinants of Health   Financial Resource Strain: Not on file  Food Insecurity: Not on file  Transportation Needs: Not on file  Physical Activity: Not on file  Stress: Not on file  Social Connections: Not on file     Family History: The patient's family history includes Barrett's esophagus in her father; COPD in her father; Heart failure in her mother; Hypertension in her mother; Lymphoma in her father; Stroke in her mother. ROS:   Please see the history of present illness.  All 14 point review of systems negative except as described per history of present illness  EKGs/Labs/Other Studies Reviewed:      Recent Labs: No results found for requested labs within last 8760 hours.  Recent Lipid Panel    Component Value Date/Time   CHOL 204 (H) 11/16/2019 0802   TRIG 96 11/16/2019 0802   HDL 75 11/16/2019 0802   CHOLHDL 2.7 11/16/2019 0802   LDLCALC 112 (H) 11/16/2019 0802    Physical Exam:    VS:  BP 124/84 (BP Location: Left Arm, Patient Position: Sitting)   Pulse 68   Ht 5\' 3"  (1.6 m)   Wt 119 lb (54 kg)   SpO2 98%   BMI 21.08 kg/m     Wt  Readings from Last 3 Encounters:  10/04/20 119 lb (54 kg)  07/25/20 121 lb 3.2 oz (55 kg)  03/08/20 118 lb (53.5 kg)     GEN:  Well nourished, well developed in no acute distress HEENT: Normal NECK: No JVD; No carotid bruits LYMPHATICS: No lymphadenopathy CARDIAC: RRR, no murmurs, no rubs, no gallops RESPIRATORY:  Clear to auscultation without rales, wheezing or rhonchi  ABDOMEN: Soft, non-tender, non-distended MUSCULOSKELETAL:  No edema; No deformity  SKIN: Warm and dry LOWER EXTREMITIES: no swelling NEUROLOGIC:  Alert and oriented x 3 PSYCHIATRIC:  Normal affect   ASSESSMENT:    1. Chest pain of uncertain etiology   2. SVT (supraventricular tachycardia) (HCC)   3. Coronary artery disease involving native coronary artery of native heart without angina pectoris   4. Ascending aortic aneurysm (HCC)   5. Palpitations   6. Dyslipidemia    PLAN:    In order of problems listed above:  1. Chest pain denies having any.  Stress test negative, she does have elevated calcium score however. 2. Supraventricular tachycardia successfully managed with calcium channel blocker which I will continue. 3. Coronary artery disease.  Again the stress test negative calcium score noted to be elevated.  The key is risk factors modifications. 4. Ascending aortic aneurysm she will be scheduled to have CT of her chest again.  No contrast. 5. Palpitations denies having any. 6. Dyslipidemia.  I did review her fasting profile from [facility] which show LDL of 138 and HDL 79.  I recalculated her risks which came is very low 0.6.  We had a long discussion about this and of course the concern is the fact that she does have elevated calcium score, we reconsider again potentially using statins.  For now we elected to go with diet and exercise alone.  I also recalculated her risk for 10 years secondary consideration her calcium score using Mesa calculator, it came as 4.6% which is still low.   Medication  Adjustments/Labs and Tests Ordered: Current medicines are reviewed at length with the patient today.  Concerns regarding medicines are outlined above.  Orders Placed This Encounter  Procedures  . EKG 12-Lead   Medication changes: No orders of the defined types were placed in this encounter.   Signed, 03/10/20, MD, [facility] 10/04/2020 9:25 AM    [facility]

## 2020-10-04 NOTE — Patient Instructions (Signed)
Medication Instructions:  Your physician recommends that you continue on your current medications as directed. Please refer to the Current Medication list given to you today.  *If you need a refill on your cardiac medications before your next appointment, please call your pharmacy*   Lab Work: None If you have labs (blood work) drawn today and your tests are completely normal, you will receive your results only by: Marland Kitchen MyChart Message (if you have MyChart) OR . A paper copy in the mail If you have any lab test that is abnormal or we need to change your treatment, we will call you to review the results.   Testing/Procedures: Non-Cardiac CT scanning, (CAT scanning), is a noninvasive, special x-ray that produces cross-sectional images of the body using x-rays and a computer. CT scans help physicians diagnose and treat medical conditions. For some CT exams, a contrast material is used to enhance visibility in the area of the body being studied. CT scans provide greater clarity and reveal more details than regular x-ray exams.     Follow-Up: At [facility], you and your health needs are our priority.  As part of our continuing mission to provide you with exceptional heart care, we have created designated Provider Care Teams.  These Care Teams include your primary Cardiologist (physician) and Advanced Practice Providers (APPs -  Physician Assistants and Nurse Practitioners) who all work together to provide you with the care you need, when you need it.  We recommend signing up for the patient portal called "MyChart".  Sign up information is provided on this After Visit Summary.  MyChart is used to connect with patients for Virtual Visits (Telemedicine).  Patients are able to view lab/test results, encounter notes, upcoming appointments, etc.  Non-urgent messages can be sent to your provider as well.   To learn more about what you can do with MyChart, go to [email or web address].    Your next  appointment:   1 year(s)  The format for your next appointment:   In Person  Provider:   Gypsy Balsam, MD   Other Instructions

## 2020-10-04 NOTE — Addendum Note (Signed)
Addended by: Hazle Quant on: 10/04/2020 09:37 AM   Modules accepted: Orders

## 2020-10-09 DIAGNOSIS — M9905 Segmental and somatic dysfunction of pelvic region: Secondary | ICD-10-CM | POA: Diagnosis not present

## 2020-10-09 DIAGNOSIS — M9903 Segmental and somatic dysfunction of lumbar region: Secondary | ICD-10-CM | POA: Diagnosis not present

## 2020-10-09 DIAGNOSIS — M5386 Other specified dorsopathies, lumbar region: Secondary | ICD-10-CM | POA: Diagnosis not present

## 2020-10-09 DIAGNOSIS — M9902 Segmental and somatic dysfunction of thoracic region: Secondary | ICD-10-CM | POA: Diagnosis not present

## 2020-10-10 ENCOUNTER — Telehealth: Payer: Self-pay | Admitting: Cardiology

## 2020-10-10 NOTE — Telephone Encounter (Signed)
Spoke with patient regarding appointment for CT chest w/o contrast (Dr. Bing Matter ordered)--scheduled Monday 10/16/20 at 1:30 pm at [facility]---arrival time is 1:15 pm 1st floor radiology------No restrictions for this testing.  Patient voiced her understanding.

## 2020-10-16 ENCOUNTER — Ambulatory Visit (HOSPITAL_BASED_OUTPATIENT_CLINIC_OR_DEPARTMENT_OTHER)
Admission: RE | Admit: 2020-10-16 | Discharge: 2020-10-16 | Disposition: A | Discharge status: Home / Self Care | Payer: BC Managed Care – PPO | Source: Ambulatory Visit | Attending: Cardiology | Admitting: Cardiology

## 2020-10-16 ENCOUNTER — Other Ambulatory Visit: Payer: Self-pay

## 2020-10-16 DIAGNOSIS — I712 Thoracic aortic aneurysm, without rupture: Secondary | ICD-10-CM | POA: Diagnosis not present

## 2020-10-16 DIAGNOSIS — E785 Hyperlipidemia, unspecified: Secondary | ICD-10-CM

## 2020-10-16 DIAGNOSIS — I471 Supraventricular tachycardia: Secondary | ICD-10-CM | POA: Insufficient documentation

## 2020-10-16 DIAGNOSIS — I700 Atherosclerosis of aorta: Secondary | ICD-10-CM | POA: Diagnosis not present

## 2020-10-16 DIAGNOSIS — I2510 Atherosclerotic heart disease of native coronary artery without angina pectoris: Secondary | ICD-10-CM | POA: Diagnosis not present

## 2020-10-16 DIAGNOSIS — R002 Palpitations: Secondary | ICD-10-CM | POA: Diagnosis not present

## 2020-10-16 DIAGNOSIS — J984 Other disorders of lung: Secondary | ICD-10-CM | POA: Diagnosis not present

## 2020-10-16 DIAGNOSIS — R079 Chest pain, unspecified: Secondary | ICD-10-CM | POA: Insufficient documentation

## 2020-10-16 DIAGNOSIS — I7121 Aneurysm of the ascending aorta, without rupture: Secondary | ICD-10-CM

## 2020-10-17 ENCOUNTER — Telehealth: Payer: Self-pay

## 2020-10-17 NOTE — Telephone Encounter (Signed)
-----   Message from Georgeanna Lea, MD sent at 10/17/2020 12:01 PM EST ----- Aorta measuring 3.7 cm, continue monitoring, no need to intervene, pulmonary nodule stable

## 2020-10-17 NOTE — Telephone Encounter (Signed)
Spoke with patient regarding results and recommendation.  Patient verbalizes understanding and is agreeable to plan of care. Advised patient to call back with any issues or concerns.  

## 2020-10-18 ENCOUNTER — Telehealth: Payer: Self-pay | Admitting: Cardiology

## 2020-10-18 MED ORDER — DILTIAZEM HCL ER COATED BEADS 120 MG PO CP24: ORAL_CAPSULE | ORAL | 2 refills | Status: DC

## 2020-10-18 NOTE — Telephone Encounter (Signed)
Refill sent to pharmacy.   Cardizem 120mg 

## 2020-10-18 NOTE — Telephone Encounter (Signed)
*  STAT* If patient is at the pharmacy, call can be transferred to refill team.   1. Which medications need to be refilled? (please list name of each medication and dose if known) diltiazem (CARDIZEM CD) 120 MG 24 hr capsule  2. Which pharmacy/location (including street and city if local pharmacy) is medication to be sent to? [facility]  3. Do they need a 30 day or 90 day supply? 90 day supply

## 2020-11-08 ENCOUNTER — Ambulatory Visit: Payer: BC Managed Care – PPO

## 2020-12-19 ENCOUNTER — Ambulatory Visit
Admission: RE | Admit: 2020-12-19 | Discharge: 2020-12-19 | Disposition: A | Discharge status: Home / Self Care | Payer: 59 | Source: Ambulatory Visit | Attending: Family Medicine | Admitting: Family Medicine

## 2020-12-19 ENCOUNTER — Other Ambulatory Visit: Payer: Self-pay

## 2020-12-19 DIAGNOSIS — Z1231 Encounter for screening mammogram for malignant neoplasm of breast: Secondary | ICD-10-CM

## 2021-01-02 ENCOUNTER — Other Ambulatory Visit: Payer: BC Managed Care – PPO

## 2021-07-10 ENCOUNTER — Other Ambulatory Visit: Payer: Self-pay | Admitting: Cardiology

## 2021-08-10 DIAGNOSIS — R202 Paresthesia of skin: Secondary | ICD-10-CM | POA: Insufficient documentation

## 2021-08-10 HISTORY — DX: Paresthesia of skin: R20.2

## 2021-09-22 ENCOUNTER — Other Ambulatory Visit: Payer: Self-pay | Admitting: Cardiology

## 2021-10-09 ENCOUNTER — Encounter: Payer: Self-pay | Admitting: Cardiology

## 2021-10-09 ENCOUNTER — Other Ambulatory Visit: Payer: Self-pay

## 2021-10-09 ENCOUNTER — Ambulatory Visit (INDEPENDENT_AMBULATORY_CARE_PROVIDER_SITE_OTHER): Payer: Self-pay | Admitting: Cardiology

## 2021-10-09 VITALS — BP 130/82 | HR 86 | Ht 63.0 in | Wt 121.0 lb

## 2021-10-09 DIAGNOSIS — I2510 Atherosclerotic heart disease of native coronary artery without angina pectoris: Secondary | ICD-10-CM

## 2021-10-09 DIAGNOSIS — K8020 Calculus of gallbladder without cholecystitis without obstruction: Secondary | ICD-10-CM | POA: Insufficient documentation

## 2021-10-09 DIAGNOSIS — R002 Palpitations: Secondary | ICD-10-CM

## 2021-10-09 DIAGNOSIS — E785 Hyperlipidemia, unspecified: Secondary | ICD-10-CM

## 2021-10-09 DIAGNOSIS — R0609 Other forms of dyspnea: Secondary | ICD-10-CM

## 2021-10-09 DIAGNOSIS — I471 Supraventricular tachycardia: Secondary | ICD-10-CM

## 2021-10-09 NOTE — Patient Instructions (Signed)
Medication Instructions:  °Your physician recommends that you continue on your current medications as directed. Please refer to the Current Medication list given to you today. ° °*If you need a refill on your cardiac medications before your next appointment, please call your pharmacy* ° ° °Lab Work: °None ordered °If you have labs (blood work) drawn today and your tests are completely normal, you will receive your results only by: °MyChart Message (if you have MyChart) OR °A paper copy in the mail °If you have any lab test that is abnormal or we need to change your treatment, we will call you to review the results. ° ° °Testing/Procedures: °None ordered ° ° °Follow-Up: °At [facility], you and your health needs are our priority.  As part of our continuing mission to provide you with exceptional heart care, we have created designated Provider Care Teams.  These Care Teams include your primary Cardiologist (physician) and Advanced Practice Providers (APPs -  Physician Assistants and Nurse Practitioners) who all work together to provide you with the care you need, when you need it. ° °We recommend signing up for the patient portal called "MyChart".  Sign up information is provided on this After Visit Summary.  MyChart is used to connect with patients for Virtual Visits (Telemedicine).  Patients are able to view lab/test results, encounter notes, upcoming appointments, etc.  Non-urgent messages can be sent to your provider as well.   °To learn more about what you can do with MyChart, go to [email or web address].   ° °Your next appointment:   °12 month(s) ° °The format for your next appointment:   °In Person ° °Provider:   °Robert Krasowski, MD ° ° °Other Instructions °NA  °

## 2021-10-09 NOTE — Progress Notes (Signed)
Cardiology Office Note:    Date:  10/09/2021   ID:  Linda Garrett, DOB 01-Dec-1960, MRN [id number]  PCP:  Daisy Floro, MD  Cardiologist:  Gypsy Balsam, MD    Referring MD: Daisy Floro, MD   Chief Complaint  Patient presents with   Tachycardia    100's  Doing fine ex-smoker but  History of Present Illness:    Linda Garrett is a 60 y.o. female  with past medical history significant for supraventricular tachycardia, that is successfully suppressed with calcium channel blocker, essential hypertension which is controlled, ascending aortic aneurysm, calcium score 49.  She did have a stress test done after that which showed no evidence of ischemia.  She comes today 2 months follow-up overall doing well.  She notices her heart rate being elevated sometimes but this is an old problems.  Denies have any chest pain tightness squeezing pressure burning chest, exercise on the regular basis have no difficulty doing it.  Past Medical History:  Diagnosis Date   Arthralgia of both hands 01/21/2018   Ascending aortic aneurysm 03/08/2020   Atypical chest pain 06/11/2019   Cervicogenic headache 02/04/2019   Chronic allergic rhinitis 12/14/2014   Coronary artery disease positive calcium score of 49 08/26/2019   Dyslipidemia 07/07/2019   Dyspnea on exertion 06/11/2019   Environmental allergies 07/24/2011   Gastroesophageal reflux disease 10/03/2020   Neck pain 02/04/2019   Palpitations 06/11/2019   Primary insomnia 10/03/2020   Rheumatoid arthritis (HCC) 02/26/2018   Seasonal allergic rhinitis 12/14/2014   SVT (supraventricular tachycardia) (HCC) 07/07/2019   Tinnitus of left ear 10/03/2020   Trigger finger 02/26/2018   Vitamin D deficiency 10/03/2020    Past Surgical History:  Procedure Laterality Date   NO PAST SURGERIES      Current Medications: Current Meds  Medication Sig   albuterol (VENTOLIN HFA) 108 (90 Base) MCG/ACT inhaler Inhale 1 puff into the lungs every 4 (four) hours  as needed for shortness of breath or wheezing.   cetirizine (ZYRTEC) 10 MG tablet Take 10 mg by mouth daily as needed for allergies or rhinitis.   diltiazem (CARDIZEM) 120 MG tablet Take 120 mg by mouth daily.   diphenhydrAMINE (BENADRYL) 25 mg capsule Take 25 mg by mouth as needed for itching.   zolpidem (AMBIEN) 10 MG tablet Take 5-10 mg by mouth at bedtime as needed for sleep.     Allergies:   Tramadol, Tizanidine hcl, and Erythromycin   Social History   Socioeconomic History   Marital status: Married    Spouse name: Not on file   Number of children: Not on file   Years of education: Not on file   Highest education level: Not on file  Occupational History   Not on file  Tobacco Use   Smoking status: Never   Smokeless tobacco: Never  Vaping Use   Vaping Use: Never used  Substance and Sexual Activity   Alcohol use: Yes   Drug use: Never   Sexual activity: Not on file  Other Topics Concern   Not on file  Social History Narrative   Not on file   Social Determinants of Health   Financial Resource Strain: Not on file  Food Insecurity: Not on file  Transportation Needs: Not on file  Physical Activity: Not on file  Stress: Not on file  Social Connections: Not on file     Family History: The patient's family history includes Barrett's esophagus in her father; COPD in her father;  Heart failure in her mother; Hypertension in her mother; Lymphoma in her father; Stroke in her mother. ROS:   Please see the history of present illness.    All 14 point review of systems negative except as described per history of present illness  EKGs/Labs/Other Studies Reviewed:      Recent Labs: No results found for requested labs within last 8760 hours.  Recent Lipid Panel    Component Value Date/Time   CHOL 204 (H) 11/16/2019 0802   TRIG 96 11/16/2019 0802   HDL 75 11/16/2019 0802   CHOLHDL 2.7 11/16/2019 0802   LDLCALC 112 (H) 11/16/2019 0802    Physical Exam:    VS:  BP  130/82 (BP Location: Left Arm, Patient Position: Sitting)    Pulse 86    Ht 5\' 3"  (1.6 m)    Wt 121 lb (54.9 kg)    SpO2 96%    BMI 21.43 kg/m     Wt Readings from Last 3 Encounters:  10/09/21 121 lb (54.9 kg)  10/04/20 119 lb (54 kg)  07/25/20 121 lb 3.2 oz (55 kg)     GEN:  Well nourished, well developed in no acute distress HEENT: Normal NECK: No JVD; No carotid bruits LYMPHATICS: No lymphadenopathy CARDIAC: RRR, no murmurs, no rubs, no gallops RESPIRATORY:  Clear to auscultation without rales, wheezing or rhonchi  ABDOMEN: Soft, non-tender, non-distended MUSCULOSKELETAL:  No edema; No deformity  SKIN: Warm and dry LOWER EXTREMITIES: no swelling NEUROLOGIC:  Alert and oriented x 3 PSYCHIATRIC:  Normal affect   ASSESSMENT:    1. SVT (supraventricular tachycardia) ([place])   2. Coronary artery disease involving native coronary artery of native heart without angina pectoris   3. Palpitations   4. Dyspnea on exertion   5. Dyslipidemia    PLAN:    In order of problems listed above:  Calcification of the coronary artery calcium score only 49.  Calculated risk is 4.6 for 10 years based on Mesa score.  We will continue risk factors modification exercises good diet. Palpitations she is taking Cardizem CD 120 and she is very satisfied with the effect of this medication will continue. Dyspnea on exertion but at the same time she is able to exercise quite aggressively we will continue present management.   Medication Adjustments/Labs and Tests Ordered: Current medicines are reviewed at length with the patient today.  Concerns regarding medicines are outlined above.  No orders of the defined types were placed in this encounter.  Medication changes: No orders of the defined types were placed in this encounter.   Signed, Park Liter, MD, [facility] 10/09/2021 4:25 PM    [place]

## 2021-10-27 IMAGING — CT CT CHEST W/O CM
2 of 3 series · 15 of 36 positions shown, 18 images · non-contrast
Comparison: Cardiac CT chest dated 07/02/2019

CLINICAL DATA: Ascending aortic aneurysm

EXAM:
CT CHEST WITHOUT CONTRAST
TECHNIQUE: Multidetector CT imaging of the chest was performed following the
standard protocol without IV contrast.

[Series 2: thorax · axial · 0.62mm/px · z∈[-327,-43]mm · 12 of 168 slices shown, 15 images]
[im 13/168  mediastinal]
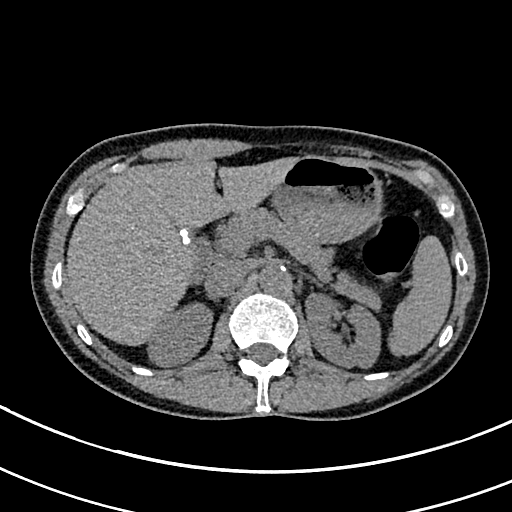
[im 13/168  lung]
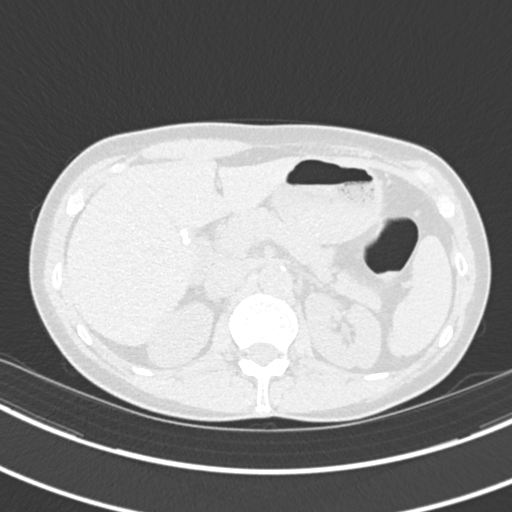
[im 25/168  lung]
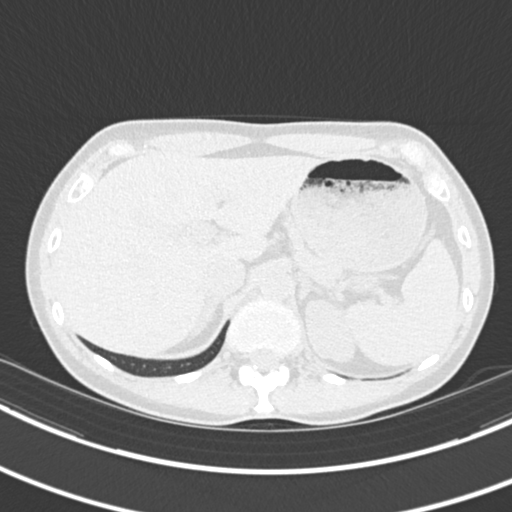
[im 38/168  lung]
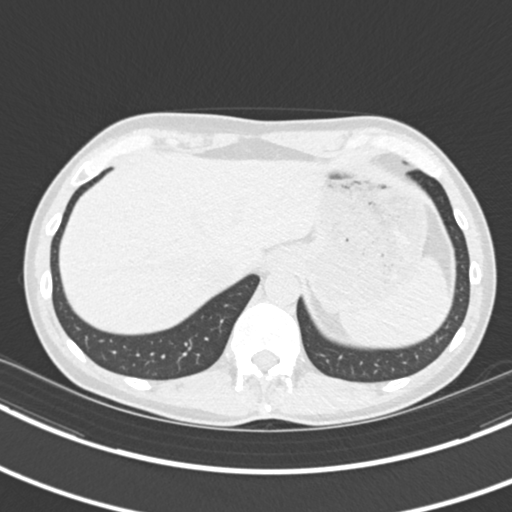
[im 50/168  lung]
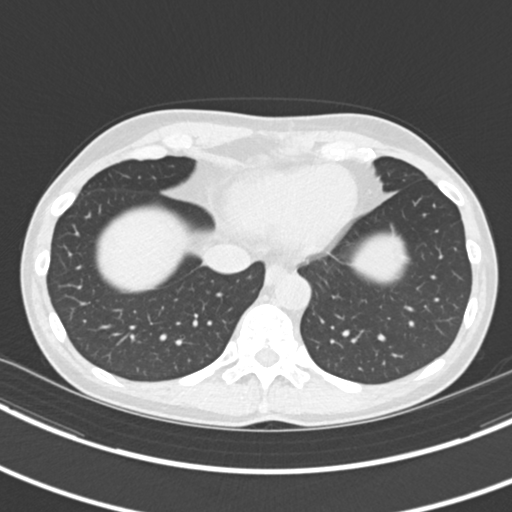
[im 62/168  mediastinal]
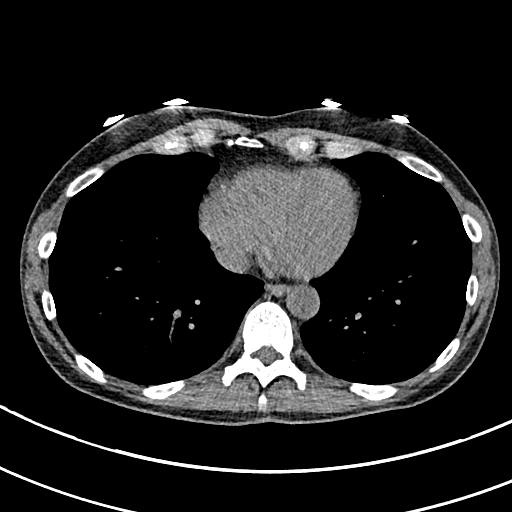
[im 62/168  lung]
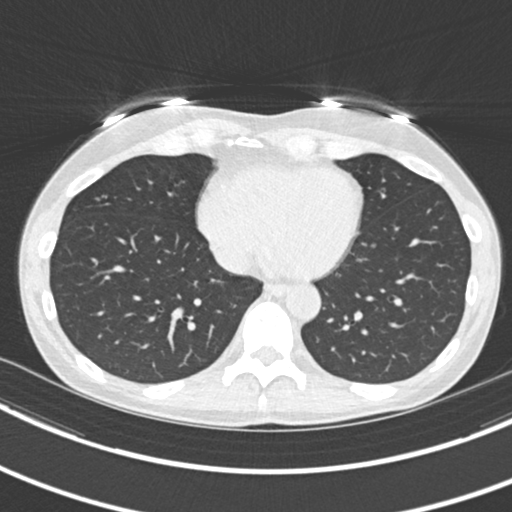
[im 75/168  lung]
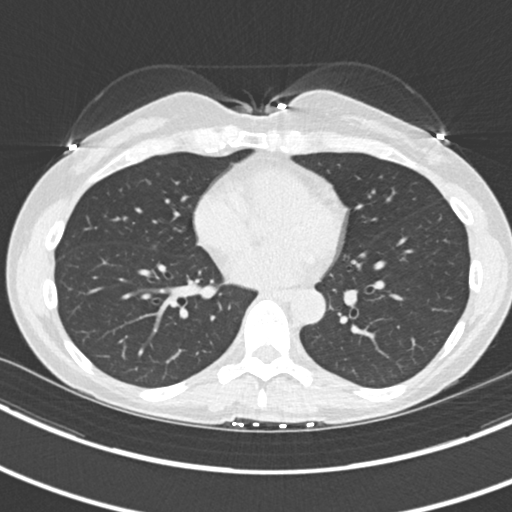
[im 93/168  lung]
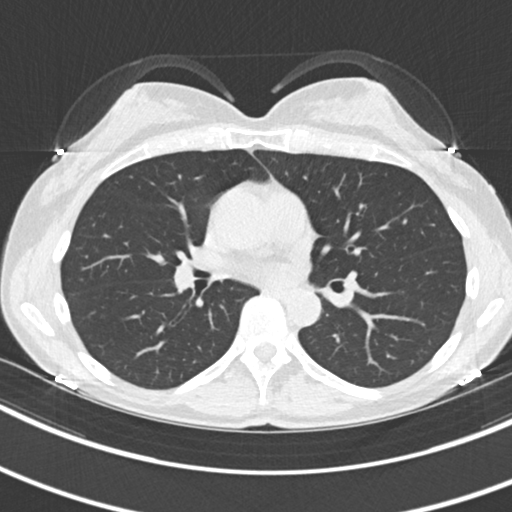
[im 106/168  lung]
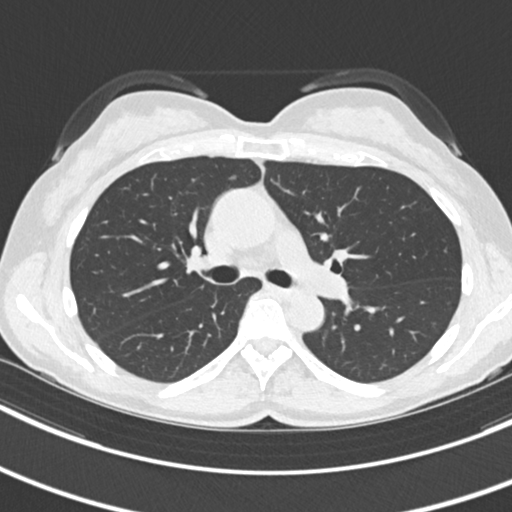
[im 118/168  mediastinal]
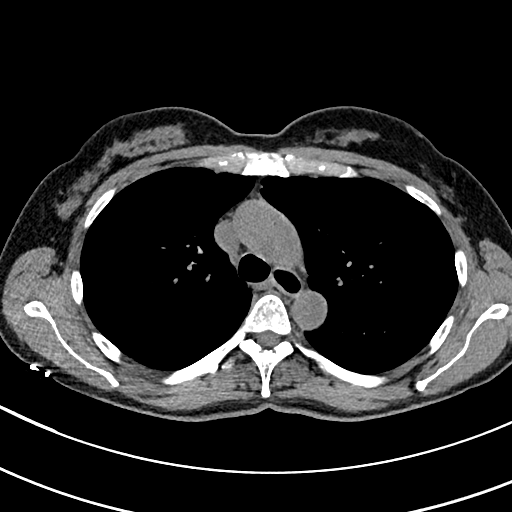
[im 118/168  lung]
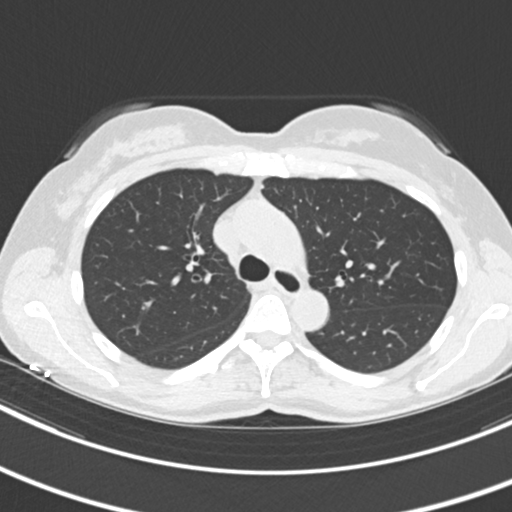
[im 130/168  lung]
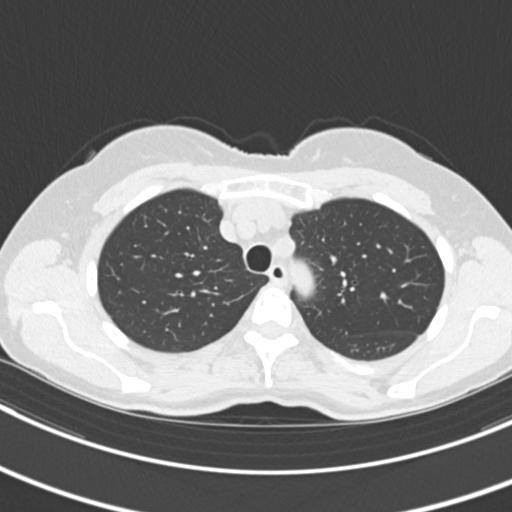
[im 143/168  lung]
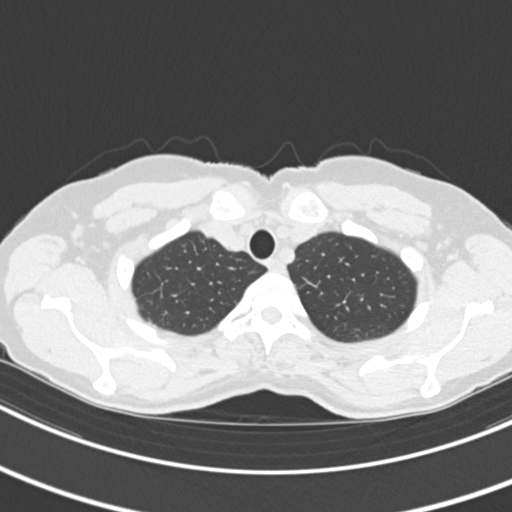
[im 155/168  lung]
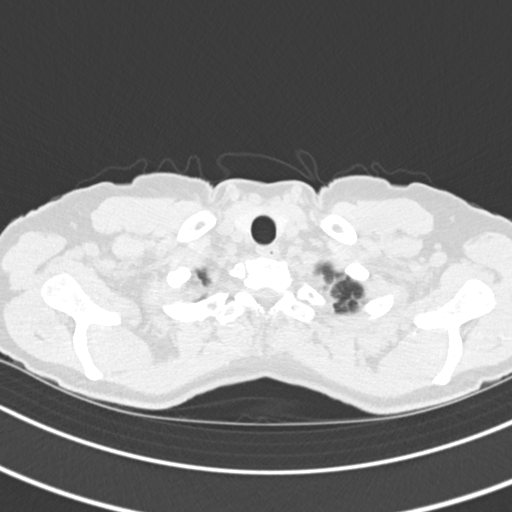

[Series 5: coronal · coronal · 0.64mm/px · 3 of 108 slices shown]
[im 22/108  lung]
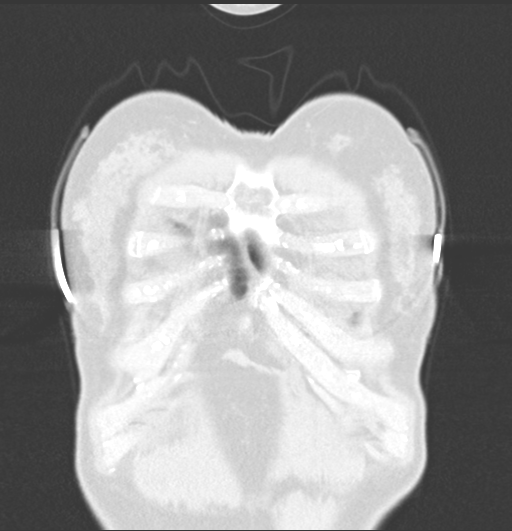
[im 43/108  lung]
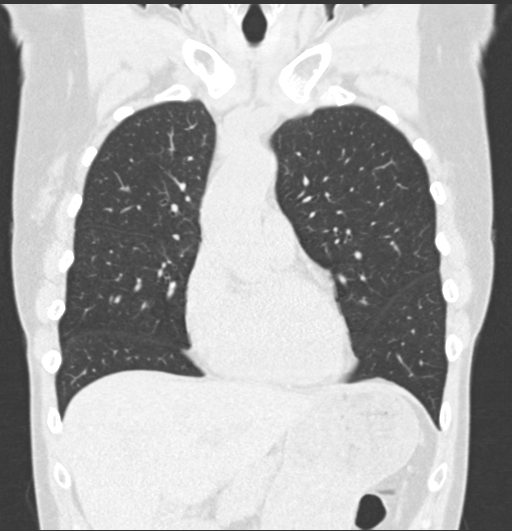
[im 65/108  lung]
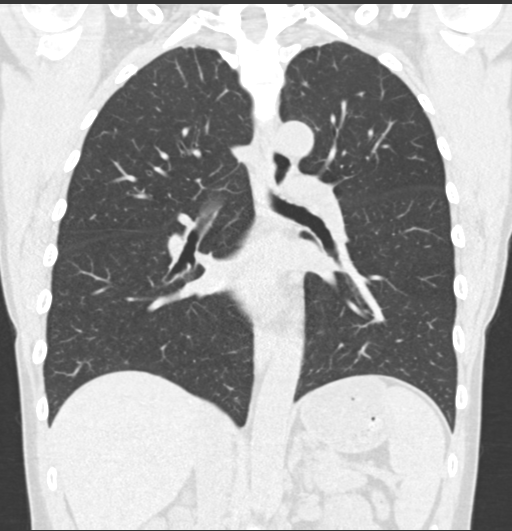

[15 of 36 positions shown; findings below may reference images not displayed]

FINDINGS: Cardiovascular: Heart is normal in size.  No pericardial effusion.

Ectasia of the ascending thoracic aorta, measuring 3.7 cm.

Mild coronary atherosclerosis of the LAD.

Mediastinum/Nodes: No suspicious mediastinal lymphadenopathy.

Visualized thyroid is unremarkable.

Lungs/Pleura: Mild biapical pleural-parenchymal scarring.

3 mm subpleural nodule in the right middle lobe (series 3/image 98),
unchanged. Additional 3 mm right middle lobe nodule (series 3/image
84), unchanged. Two inferior right upper lobe nodules measuring 2-3
mm (series 3/image 87), unchanged.

No new/suspicious pulmonary nodules.

No focal consolidation.

No pleural effusion or pneumothorax.

Upper Abdomen: Visualized upper abdomen is notable for
cholelithiasis, without associated inflammatory changes.

Musculoskeletal: Visualized osseous structures are within normal
limits.
IMPRESSION: Ectasia of the ascending thoracic aorta measuring up to 3.7 cm. No
evidence of thoracic aortic aneurysm.

Small right lung nodules measuring to 3 mm, unchanged. Greater than
one year stability has been demonstrated. Dedicated follow-up
imaging is not required per [HOSPITAL] guidelines. This
recommendation follows the consensus statement: Guidelines for
Management of Small Pulmonary Nodules Detected on CT Images: From

Aortic Atherosclerosis (X69RG-AH0.0).

## 2021-12-22 ENCOUNTER — Other Ambulatory Visit: Payer: Self-pay | Admitting: Cardiology

## 2022-01-08 ENCOUNTER — Telehealth: Payer: Self-pay | Admitting: Cardiology

## 2022-01-08 MED ORDER — DILTIAZEM HCL 120 MG PO TABS: 120.0000 mg | ORAL_TABLET | Freq: Every day | ORAL | 2 refills | Status: DC

## 2022-01-08 NOTE — Telephone Encounter (Signed)
Rx refill sent to pharmacy. 

## 2022-01-08 NOTE — Telephone Encounter (Signed)
? ?*  STAT* If patient is at the pharmacy, call can be transferred to refill team. ? ? ?1. Which medications need to be refilled? (please list name of each medication and dose if known)  ? diltiazem (CARDIZEM) 120 MG tablet  ? ? ?2. Which pharmacy/location (including street and city if local pharmacy) is medication to be sent to?[facility] - [place], [place] - [address] [facility] AT Portal to Reg[facility] Sites ? ?3. Do they need a 30 day or 90 day supply? 90 days ? ?

## 2022-01-16 ENCOUNTER — Telehealth: Payer: Self-pay | Admitting: Cardiology

## 2022-01-16 NOTE — Telephone Encounter (Signed)
Left message for patient to call back  

## 2022-01-16 NOTE — Telephone Encounter (Signed)
Pt is needing diagnosis code as well as an npi number for visit on 10/09/2021 for reimbursement form.. please advise ?

## 2022-01-17 NOTE — Telephone Encounter (Signed)
Called patient and gave her the icd-10 codes for an office visit and ekg for her reimbursement paperwork. Patient had no further questions. ?

## 2022-01-17 NOTE — Telephone Encounter (Signed)
Patient was returning call 

## 2022-01-22 ENCOUNTER — Telehealth: Payer: Self-pay | Admitting: Cardiology

## 2022-01-22 NOTE — Telephone Encounter (Signed)
Pt c/o medication issue: ? ?1. Name of Medication: diltiazem (CARDIZEM) 120 MG tablet ? ?2. How are you currently taking this medication (dosage and times per day)? N/A ? ?3. Are you having a reaction (difficulty breathing--STAT)? No  ? ?4. What is your medication issue? Patient is calling stating she cannot swallow these tablets because they are too big. She is requesting a new prescription be written for capsules instead. ?

## 2022-01-23 ENCOUNTER — Other Ambulatory Visit: Payer: Self-pay

## 2022-01-23 MED ORDER — DILTIAZEM HCL ER COATED BEADS 120 MG PO CP24: 120.0000 mg | ORAL_CAPSULE | Freq: Every day | ORAL | 3 refills | Status: DC

## 2022-01-23 NOTE — Telephone Encounter (Signed)
Diltiazem 120mg  capsules Rx sent to pharmacy ?

## 2022-03-21 ENCOUNTER — Ambulatory Visit (INDEPENDENT_AMBULATORY_CARE_PROVIDER_SITE_OTHER): Payer: 59 | Admitting: Pulmonary Disease

## 2022-03-21 ENCOUNTER — Encounter: Payer: Self-pay | Admitting: Pulmonary Disease

## 2022-03-21 VITALS — BP 126/84 | HR 65 | Ht 63.0 in | Wt 118.8 lb

## 2022-03-21 DIAGNOSIS — R059 Cough, unspecified: Secondary | ICD-10-CM

## 2022-03-21 MED ORDER — FLUTICASONE FUROATE-VILANTEROL 100-25 MCG/ACT IN AEPB: 1.0000 | INHALATION_SPRAY | Freq: Every day | RESPIRATORY_TRACT | 0 refills | Status: DC

## 2022-03-21 NOTE — Progress Notes (Signed)
Synopsis: Referred in June 2023 for cough by Darlen Roundharles Alan, MD  Subjective:   PATIENT ID: Linda Garrett GENDER: female DOB: Jul 01, 1961, MRN: [id number]  HPI  Chief Complaint  Patient presents with   Consult    Referred by PCP for chronic cough that she has had for the past 6 months. Denies any increased SOB and wheezing.    Linda Garrett is a 61 year old woman, never smoker with history of SVT, allergic rhinitis, GERD and rheumatoid arthritis who is referred to pulmonary clinic for cough.   She reports having dry hacking cough over the past 6 months.  She had covid 19 infection last year. She was hospitalized for pneumonia in her 30s for 8 days. She reports having chest tightness at night. The cough does not keep her awake at night. She does have seasonal allergies year round with sinus congestion and post-nasal drainage. She does have significant GERD symptoms and is followed by Dr. Loreta AveMann. She denies any wheezing or significant dyspnea.   She is a former Runner, broadcasting/film/videoteacher. She is a never smoker but did have second hand smoke exposure in childhood.   Past Medical History:  Diagnosis Date   Arthralgia of both hands 01/21/2018   Ascending aortic aneurysm (HCC) 03/08/2020   Atypical chest pain 06/11/2019   Cervicogenic headache 02/04/2019   Chronic allergic rhinitis 12/14/2014   Coronary artery disease positive calcium score of 49 08/26/2019   Dyslipidemia 07/07/2019   Dyspnea on exertion 06/11/2019   Environmental allergies 07/24/2011   Gastroesophageal reflux disease 10/03/2020   Neck pain 02/04/2019   Palpitations 06/11/2019   Primary insomnia 10/03/2020   Rheumatoid arthritis (HCC) 02/26/2018   Seasonal allergic rhinitis 12/14/2014   SVT (supraventricular tachycardia) (HCC) 07/07/2019   Tinnitus of left ear 10/03/2020   Trigger finger 02/26/2018   Vitamin D deficiency 10/03/2020     Family History  Problem Relation Age of Onset   Hypertension Mother    Stroke Mother    Heart failure Mother     Lymphoma Father    COPD Father    Barrett's esophagus Father      Social History   Socioeconomic History   Marital status: Married    Spouse name: Not on file   Number of children: Not on file   Years of education: Not on file   Highest education level: Not on file  Occupational History   Not on file  Tobacco Use   Smoking status: Never   Smokeless tobacco: Never  Vaping Use   Vaping Use: Never used  Substance and Sexual Activity   Alcohol use: Yes   Drug use: Never   Sexual activity: Not on file  Other Topics Concern   Not on file  Social History Narrative   Not on file   Social Determinants of Health   Financial Resource Strain: Not on file  Food Insecurity: Not on file  Transportation Needs: Not on file  Physical Activity: Not on file  Stress: Not on file  Social Connections: Not on file  Intimate Partner Violence: Not on file     Allergies  Allergen Reactions   Tramadol Shortness Of Breath    Felt like she was going to stop breathing   Tizanidine Hcl Other (See Comments)   Erythromycin Nausea And Vomiting and Nausea Only     Outpatient Medications Prior to Visit  Medication Sig Dispense Refill   albuterol (VENTOLIN HFA) 108 (90 Base) MCG/ACT inhaler Inhale 1 puff into the lungs  every 4 (four) hours as needed for shortness of breath or wheezing.     diltiazem (CARDIZEM CD) 120 MG 24 hr capsule Take 1 capsule (120 mg total) by mouth daily. 90 capsule 3   diphenhydrAMINE (BENADRYL) 25 mg capsule Take 25 mg by mouth as needed for itching.     zolpidem (AMBIEN) 10 MG tablet Take 5-10 mg by mouth at bedtime as needed for sleep.     cetirizine (ZYRTEC) 10 MG tablet Take 10 mg by mouth daily as needed for allergies or rhinitis.     No facility-administered medications prior to visit.   Review of Systems  Constitutional:  Negative for chills, fever, malaise/fatigue and weight loss.  HENT:  Positive for congestion. Negative for sinus pain and sore throat.    Eyes: Negative.   Respiratory:  Positive for cough. Negative for hemoptysis, sputum production, shortness of breath and wheezing.   Cardiovascular:  Negative for chest pain, palpitations, orthopnea, claudication and leg swelling.  Gastrointestinal:  Negative for abdominal pain, heartburn, nausea and vomiting.  Genitourinary: Negative.   Musculoskeletal:  Positive for joint pain. Negative for myalgias.  Skin:  Positive for itching. Negative for rash.  Neurological:  Positive for headaches. Negative for weakness.  Endo/Heme/Allergies: Negative.   Psychiatric/Behavioral: Negative.     Objective:   Vitals:   03/21/22 1406  BP: 126/84  Pulse: 65  SpO2: 99%  Weight: 118 lb 12.8 oz (53.9 kg)  Height: 5\' 3"  (1.6 m)   Physical Exam Constitutional:      General: She is not in acute distress.    Appearance: She is not ill-appearing.  HENT:     Head: Normocephalic and atraumatic.  Eyes:     General: No scleral icterus.    Conjunctiva/sclera: Conjunctivae normal.     Pupils: Pupils are equal, round, and reactive to light.  Cardiovascular:     Rate and Rhythm: Normal rate and regular rhythm.     Pulses: Normal pulses.     Heart sounds: Normal heart sounds. No murmur heard. Pulmonary:     Effort: Pulmonary effort is normal.     Breath sounds: Normal breath sounds. No wheezing, rhonchi or rales.  Abdominal:     General: Bowel sounds are normal.     Palpations: Abdomen is soft.  Musculoskeletal:     Right lower leg: No edema.     Left lower leg: No edema.  Lymphadenopathy:     Cervical: No cervical adenopathy.  Skin:    General: Skin is warm and dry.  Neurological:     General: No focal deficit present.     Mental Status: She is alert.  Psychiatric:        Mood and Affect: Mood normal.        Behavior: Behavior normal.        Thought Content: Thought content normal.        Judgment: Judgment normal.     CBC No results found for: "WBC", "RBC", "HGB", "HCT", "PLT", "MCV",  "MCH", "MCHC", "RDW", "LYMPHSABS", "MONOABS", "EOSABS", "BASOSABS"  Chest imaging: CT Chest 10/16/20 Ectasia of the ascending thoracic aorta measuring up to 3.7 cm. No evidence of thoracic aortic aneurysm.   Small right lung nodules measuring to 3 mm, unchanged. Greater than one year stability has been demonstrated. Dedicated follow-up imaging is not required per Fleischner Society guidelines. This recommendation follows the consensus statement: Guidelines for Management of Small Pulmonary Nodules Detected on CT Images: From the Fleischner Society 2017; Radiology 2017; 284:228-243.  Aortic Atherosclerosis  PFT:     No data to display          Labs:  Path:  Echo:  Heart Catheterization:  Esophogram 06/24/2019 1. Mild-to-moderate gastroesophageal reflux elicited. No hiatal hernia. 2. Mild esophageal dysmotility, with a chronic reflux related dysmotility pattern. 3. No evidence of reflux esophagitis. No evidence of esophageal mass, stricture or ulcer.  Assessment & Plan:   Cough, unspecified type  Discussion: Linda Garrett is a 61 year old woman, never smoker with history of SVT, allergic rhinitis, GERD and rheumatoid arthritis who is referred to pulmonary clinic for cough.   Her cough may be due to reactive airways disease vs GERD vs post-nasal drainage.   She is to try breo ellipta 1 puff daily and monitor for improvement of her symptoms. If she notices improvement she is to let us know and we will call in prescription.   Recommend that she eat at least 2 hours prior to bedtime to reduce nocturnal reflux issues.   We will consider reflux treatment and nasal sprays in the future if her cough is not improved with inhaler therapy.  Linda Comas, MD [facility] Office: [phone]   Current Outpatient Medications:    albuterol (VENTOLIN HFA) 108 (90 Base) MCG/ACT inhaler, Inhale 1 puff into the lungs every 4 (four) hours as needed for  shortness of breath or wheezing., Disp: , Rfl:    diltiazem (CARDIZEM CD) 120 MG 24 hr capsule, Take 1 capsule (120 mg total) by mouth daily., Disp: 90 capsule, Rfl: 3   diphenhydrAMINE (BENADRYL) 25 mg capsule, Take 25 mg by mouth as needed for itching., Disp: , Rfl:    fluticasone furoate-vilanterol (BREO ELLIPTA) 100-25 MCG/ACT AEPB, Inhale 1 puff into the lungs daily., Disp: 2 each, Rfl: 0   zolpidem (AMBIEN) 10 MG tablet, Take 5-10 mg by mouth at bedtime as needed for sleep., Disp: , Rfl:

## 2022-03-21 NOTE — Patient Instructions (Addendum)
Your cough may be due to reactive airways disease or asthma and possibly due to reflux disease  Try Breo Ellipta 1 puff daily - rinse mouth out after each use  If you notice improvement in your cough/chest tightness, please let us know and we will send in prescription for you to continue on this inhaler.  Be sure to eat 2 hours prior to bed to reduce nocturnal reflux  Follow up in 2 months with pulmonary function tests

## 2022-03-29 ENCOUNTER — Encounter: Payer: Self-pay | Admitting: Pulmonary Disease

## 2022-06-04 ENCOUNTER — Telehealth: Payer: Self-pay | Admitting: Cardiology

## 2022-06-04 ENCOUNTER — Other Ambulatory Visit: Payer: Self-pay

## 2022-06-04 MED ORDER — DILTIAZEM HCL ER COATED BEADS 120 MG PO CP24: 120.0000 mg | ORAL_CAPSULE | Freq: Every day | ORAL | 3 refills | Status: DC

## 2022-06-04 NOTE — Telephone Encounter (Signed)
Ref diltiazem (CARDIZEM CD) 120 MG 24 hr capsule #90 ref x 3

## 2022-06-04 NOTE — Telephone Encounter (Signed)
*  STAT* If patient is at the pharmacy, call can be transferred to refill team.   1. Which medications need to be refilled? (please list name of each medication and dose if known) diltiazem (CARDIZEM CD) 120 MG 24 hr capsule (Expired)  2. Which pharmacy/location (including street and city if local pharmacy) is medication to be sent to? [id number] - [place], [place] - [address] AT [address]OSS FROM [address]  3. Do they need a 30 day or 90 day supply? 90

## 2022-06-05 MED ORDER — DILTIAZEM HCL ER COATED BEADS 120 MG PO CP24: 120.0000 mg | ORAL_CAPSULE | Freq: Every day | ORAL | 3 refills | Status: DC

## 2022-06-05 NOTE — Telephone Encounter (Signed)
[facility] was [facility] to [facility] but instead it was sent to [facility]. Please sent to pharmacy listed below. Please advise

## 2022-08-20 ENCOUNTER — Encounter: Payer: Self-pay | Admitting: Pulmonary Disease

## 2022-08-20 NOTE — Telephone Encounter (Signed)
Dr. Dewald, please see mychart message sent by pt and advise. 

## 2022-10-23 DIAGNOSIS — Z800 Family history of malignant neoplasm of digestive organs: Secondary | ICD-10-CM | POA: Insufficient documentation

## 2022-10-23 DIAGNOSIS — Z1211 Encounter for screening for malignant neoplasm of colon: Secondary | ICD-10-CM

## 2022-10-23 DIAGNOSIS — R1310 Dysphagia, unspecified: Secondary | ICD-10-CM | POA: Insufficient documentation

## 2022-10-23 HISTORY — DX: Family history of malignant neoplasm of digestive organs: Z80.0

## 2022-10-23 HISTORY — DX: Encounter for screening for malignant neoplasm of colon: Z12.11

## 2022-10-24 ENCOUNTER — Encounter: Payer: Self-pay | Admitting: Cardiology

## 2022-10-24 ENCOUNTER — Ambulatory Visit: Payer: 59 | Attending: Cardiology | Admitting: Cardiology

## 2022-10-24 VITALS — BP 120/80 | HR 80 | Ht 63.5 in | Wt 115.1 lb

## 2022-10-24 DIAGNOSIS — R0789 Other chest pain: Secondary | ICD-10-CM

## 2022-10-24 DIAGNOSIS — E785 Hyperlipidemia, unspecified: Secondary | ICD-10-CM | POA: Diagnosis not present

## 2022-10-24 DIAGNOSIS — I471 Supraventricular tachycardia, unspecified: Secondary | ICD-10-CM | POA: Diagnosis not present

## 2022-10-24 MED ORDER — ASPIRIN 81 MG PO TBEC: 81.0000 mg | DELAYED_RELEASE_TABLET | Freq: Every day | ORAL | 3 refills | Status: DC

## 2022-10-24 NOTE — Progress Notes (Signed)
Cardiology Office Note:    Date:  10/24/2022   ID:  Linda Garrett, DOB [birth year], MRN [id number]  PCP:  Lawerance Cruel, MD  Cardiologist:  Jenne Campus, MD    Referring MD: Lawerance Cruel, MD   Chief Complaint  Patient presents with   Follow-up    Some chest pain. Hurts to take deep breath when coughing    History of Present Illness:    Linda Garrett is a 62 y.o. female with past medical history significant for supraventricular tachycardia, positive calcium score 40 92,020, dyslipidemia, dyspnea on exertion presented to [place] for regular follow-up she complain of having some chest pain which is very atypical can last for hours.  Does not happen with exercise at the same time she is able to exercise with no difficulty but she is worried about the pain.  Past Medical History:  Diagnosis Date   Arthralgia of both hands 01/21/2018   Ascending aortic aneurysm (HCC) 03/08/2020   Atypical chest pain 06/11/2019   Cervicogenic headache 02/04/2019   Chronic allergic rhinitis 12/14/2014   Colon cancer screening 10/23/2022   Coronary artery disease positive calcium score of 49 08/26/2019   Dyslipidemia 07/07/2019   Dyspnea on exertion 06/11/2019   Environmental allergies 07/24/2011   Family history of malignant neoplasm of digestive organs 10/23/2022   Gastroesophageal reflux disease 10/03/2020   Neck pain 02/04/2019   Palpitations 06/11/2019   Paresthesia 08/10/2021   Primary insomnia 10/03/2020   Rheumatoid arthritis ([place]) 02/26/2018   Seasonal allergic rhinitis 12/14/2014   SVT (supraventricular tachycardia) 07/07/2019   Tinnitus of left ear 10/03/2020   Trigger finger 02/26/2018   Vitamin D deficiency 10/03/2020    Past Surgical History:  Procedure Laterality Date   NO PAST SURGERIES      Current Medications: Current Meds  Medication Sig   albuterol (VENTOLIN HFA) 108 (90 Base) MCG/ACT inhaler Inhale 1 puff into the lungs every 4 (four) hours as needed for  shortness of breath or wheezing.   diltiazem (CARDIZEM CD) 120 MG 24 hr capsule Take 1 capsule (120 mg total) by mouth daily.   diphenhydrAMINE (BENADRYL) 25 mg capsule Take 25 mg by mouth as needed for itching.   zolpidem (AMBIEN) 10 MG tablet Take 5-10 mg by mouth at bedtime as needed for sleep.   [DISCONTINUED] fluticasone furoate-vilanterol (BREO ELLIPTA) 100-25 MCG/ACT AEPB Inhale 1 puff into the lungs daily.     Allergies:   Tizanidine, Tramadol, Tizanidine hcl, and Erythromycin   Social History   Socioeconomic History   Marital status: Married    Spouse name: Not on file   Number of children: Not on file   Years of education: Not on file   Highest education level: Not on file  Occupational History   Not on file  Tobacco Use   Smoking status: Never   Smokeless tobacco: Never  Vaping Use   Vaping Use: Never used  Substance and Sexual Activity   Alcohol use: Yes   Drug use: Never   Sexual activity: Not on file  Other Topics Concern   Not on file  Social History Narrative   Not on file   Social Determinants of Health   Financial Resource Strain: Not on file  Food Insecurity: Not on file  Transportation Needs: Not on file  Physical Activity: Not on file  Stress: Not on file  Social Connections: Not on file     Family History: The patient's family history includes Barrett's esophagus in her  father; COPD in her father; Heart failure in her mother; Hypertension in her mother; Lymphoma in her father; Stroke in her mother. ROS:   Please see the history of present illness.    All 14 point review of systems negative except as described per history of present illness  EKGs/Labs/Other Studies Reviewed:      Recent Labs: No results found for requested labs within last 365 days.  Recent Lipid Panel    Component Value Date/Time   CHOL 204 (H) 11/16/2019 0802   TRIG 96 11/16/2019 0802   HDL 75 11/16/2019 0802   CHOLHDL 2.7 11/16/2019 0802   LDLCALC 112 (H)  11/16/2019 0802    Physical Exam:    VS:  BP 120/80 (BP Location: Left Arm, Patient Position: Sitting)   Pulse 80   Ht 5' 3.5" (1.613 m)   Wt 115 lb 1.3 oz (52.2 kg)   SpO2 97%   BMI 20.07 kg/m     Wt Readings from Last 3 Encounters:  10/24/22 115 lb 1.3 oz (52.2 kg)  03/21/22 118 lb 12.8 oz (53.9 kg)  10/09/21 121 lb (54.9 kg)     GEN:  Well nourished, well developed in no acute distress HEENT: Normal NECK: No JVD; No carotid bruits LYMPHATICS: No lymphadenopathy CARDIAC: RRR, no murmurs, no rubs, no gallops RESPIRATORY:  Clear to auscultation without rales, wheezing or rhonchi  ABDOMEN: Soft, non-tender, non-distended MUSCULOSKELETAL:  No edema; No deformity  SKIN: Warm and dry LOWER EXTREMITIES: no swelling NEUROLOGIC:  Alert and oriented x 3 PSYCHIATRIC:  Normal affect   ASSESSMENT:    1. Atypical chest pain   2. SVT (supraventricular tachycardia)   3. Dyslipidemia    PLAN:    In order of problems listed above:  Atypical chest pain.  She does have risk factors for coronary artery disease on top of that she does have already calcification of coronary arteries.  We discussed at length options for this clinical scenario on I think the best test for her will be to do coronary CT angio.  She said she wants to think about it we had a long discussion with description of the test.  She will let me know to the point close if she wants to proceed with the test. Supraventricular tachycardia: Denies having any on appropriate medications. Enlargement of the aorta only 37 mm.  It would be beneficial to do coronary CT angio which allowed me to look at the size of the aorta as well. Dyslipidemia I did review her K PN which show me her lab test from September of this year with LDL at 101 HDL 69.  Of course knowing coronary CT angio will be tremendously beneficial to determine how aggressive we need to be with management of this problem   Medication Adjustments/Labs and Tests  Ordered: Current medicines are reviewed at length with the patient today.  Concerns regarding medicines are outlined above.  No orders of the defined types were placed in this encounter.  Medication changes: No orders of the defined types were placed in this encounter.   Signed, Park Liter, MD, [facility] 10/24/2022 3:12 PM    [place]

## 2022-10-24 NOTE — Patient Instructions (Addendum)
Medication Instructions:   START: Aspirin 81mg  Enteric Coated - 1 tablet daily   Lab Work: None Ordered If you have labs (blood work) drawn today and your tests are completely normal, you will receive your results only by: MyChart Message (if you have MyChart) OR A paper copy in the mail If you have any lab test that is abnormal or we need to change your treatment, we will call you to review the results.   Testing/Procedures: None Ordered   Follow-Up: At [facility], you and your health needs are our priority.  As part of our continuing mission to provide you with exceptional heart care, we have created designated Provider Care Teams.  These Care Teams include your primary Cardiologist (physician) and Advanced Practice Providers (APPs -  Physician Assistants and Nurse Practitioners) who all work together to provide you with the care you need, when you need it.  We recommend signing up for the patient portal called "MyChart".  Sign up information is provided on this After Visit Summary.  MyChart is used to connect with patients for Virtual Visits (Telemedicine).  Patients are able to view lab/test results, encounter notes, upcoming appointments, etc.  Non-urgent messages can be sent to your provider as well.   To learn more about what you can do with MyChart, go to [email or web address].    Your next appointment:   6 month(s)to 1 year  3 months follow up if you decide to have CT Angio  The format for your next appointment:   In Person  Provider:   Jenne Campus, MD    Other Instructions NA

## 2022-12-25 ENCOUNTER — Telehealth: Payer: Self-pay | Admitting: Cardiology

## 2022-12-25 NOTE — Telephone Encounter (Signed)
Returned patient call and LM ?

## 2022-12-25 NOTE — Telephone Encounter (Signed)
Pt is calling to discuss appt she recently had with Dr. Agustin Cree and the procedure he suggested. She states she can not remember what it was called and would like to discuss this.

## 2022-12-25 NOTE — Telephone Encounter (Signed)
Spoke with pt about CT scan and answered pts questions. She verbalized understanding and stated that she would call when she decides to schedule it.

## 2023-01-29 ENCOUNTER — Encounter: Payer: Self-pay | Admitting: Cardiology

## 2023-02-19 ENCOUNTER — Telehealth: Payer: Self-pay | Admitting: Cardiology

## 2023-02-19 ENCOUNTER — Telehealth: Payer: Self-pay

## 2023-02-19 DIAGNOSIS — R072 Precordial pain: Secondary | ICD-10-CM

## 2023-02-19 MED ORDER — METOPROLOL TARTRATE 100 MG PO TABS: ORAL_TABLET | ORAL | 0 refills | Status: DC

## 2023-02-19 NOTE — Telephone Encounter (Signed)
Patient calling in to schedule a CT but there is no order for it. Please advise

## 2023-02-19 NOTE — Telephone Encounter (Signed)
Take: Metoprolol 100mg  1 tablet 2 hours prior to CT Scan to help slow heart rate for test- sent to [facility] [place]  Labwork- BMP - 1 week prior to CT Scan-  [facility] 3rd Floor  [address] You can come Monday through Friday 8:00 am to 11:30am and 1:00 to 4:00. You do not need to make an appointment as the order has already been placed. The labs you are going to have done are BMP   Your cardiac CT will be scheduled at one of the below locations:   [facility] [address] [place], [place] [address] [phone]  If scheduled at [facility], please arrive at the [facility] Entrance (Entrance C2) of [facility] 30 minutes prior to test start time. You can use the FREE valet parking offered at entrance C (encouraged to control the heart rate for the test)  Proceed to the [facility] Radiology Department (first floor) to check-in and test prep.  All radiology patients and guests should use entrance C2 at [facility], accessed from [facility], even though the hospital's physical address listed is [address]     Please follow these instructions carefully (unless otherwise directed):    On the Night Before the Test: Be sure to Drink plenty of water. Do not consume any caffeinated/decaffeinated beverages or chocolate 12 hours prior to your test. Do not take any antihistamines 12 hours prior to your test.  On the Day of the Test: Drink plenty of water until 1 hour prior to the test. Do not eat any food 4 hours prior to the test. You may take your regular medications prior to the test.  Take metoprolol (Lopressor) two hours prior to test. FEMALES- please wear underwire-free bra if available, avoid dresses & tight clothing       After the Test: Drink plenty of water. After receiving IV contrast, you may experience a mild flushed feeling. This is normal. On occasion, you  may experience a mild rash up to 24 hours after the test. This is not dangerous. If this occurs, you can take Benadryl 25 mg and increase your fluid intake. If you experience trouble breathing, this can be serious. If it is severe call 911 IMMEDIATELY. If it is mild, please call our office. If you take any of these medications: Glipizide/Metformin, Avandament, Glucavance, please do not take 48 hours after completing test unless otherwise instructed.  We will call to schedule your test 2-4 weeks out understanding that some insurance companies will need an authorization prior to the service being performed.   For non-scheduling related questions, please contact the cardiac imaging nurse navigator should you have any questions/concerns: Rockwell [place], Cardiac Imaging Nurse Navigator Larey Brick, Cardiac Imaging Nurse Navigator [facility] Direct Office Dial: [phone]   For scheduling needs, including cancellations and rescheduling, please call [place], [phone].

## 2023-02-25 ENCOUNTER — Encounter: Payer: Self-pay | Admitting: Cardiology

## 2023-02-25 LAB — BASIC METABOLIC PANEL
BUN/Creatinine Ratio: 13 (ref 12–28)
BUN: 9 mg/dL (ref 8–27)
CO2: 25 mmol/L (ref 20–29)
Calcium: 9.4 mg/dL (ref 8.7–10.3)
Chloride: 100 mmol/L (ref 96–106)
Creatinine, Ser: 0.7 mg/dL (ref 0.57–1.00)
Glucose: 101 mg/dL — ABNORMAL HIGH (ref 70–99)
Potassium: 3.9 mmol/L (ref 3.5–5.2)
Sodium: 139 mmol/L (ref 134–144)
eGFR: 98 mL/min/{1.73_m2} (ref >59)

## 2023-02-26 ENCOUNTER — Telehealth (HOSPITAL_COMMUNITY): Payer: Self-pay | Admitting: *Deleted

## 2023-02-26 NOTE — Telephone Encounter (Signed)
Reaching out to patient to offer assistance regarding upcoming cardiac imaging study; pt verbalizes understanding of appt date/time, parking situation and where to check in, pre-test NPO status and medications ordered, and verified current allergies; name and call back number provided for further questions should they arise  Larey Brick RN Navigator Cardiac Imaging [facility] [phone] office [phone] cell  Patient to take 100mg  metoprolol tartrate two hours prior cardiac CT scan. She is aware to arrive at 10:30am.

## 2023-02-27 ENCOUNTER — Telehealth: Payer: Self-pay

## 2023-02-27 ENCOUNTER — Ambulatory Visit (HOSPITAL_COMMUNITY)
Admission: RE | Admit: 2023-02-27 | Discharge: 2023-02-27 | Disposition: A | Discharge status: Home / Self Care | Payer: 59 | Source: Ambulatory Visit | Attending: Cardiology | Admitting: Cardiology

## 2023-02-27 DIAGNOSIS — R072 Precordial pain: Secondary | ICD-10-CM | POA: Insufficient documentation

## 2023-02-27 MED ORDER — NITROGLYCERIN 0.4 MG SL SUBL
SUBLINGUAL_TABLET | SUBLINGUAL | Status: AC
  Filled 2023-02-27: qty 2

## 2023-02-27 MED ORDER — NITROGLYCERIN 0.4 MG SL SUBL
0.8000 mg | SUBLINGUAL_TABLET | Freq: Once | SUBLINGUAL | Status: AC
  Administered 2023-02-27: 0.8 mg via SUBLINGUAL

## 2023-02-27 MED ORDER — IOHEXOL 350 MG/ML SOLN
100.0000 mL | Freq: Once | INTRAVENOUS | Status: AC | PRN
Start: 2023-02-27 — End: 2023-02-27
  Administered 2023-02-27: 100 mL via INTRAVENOUS

## 2023-02-27 NOTE — Telephone Encounter (Signed)
Patient notified through my chart.

## 2023-02-27 NOTE — Telephone Encounter (Signed)
-----   Message from Georgeanna Lea, MD sent at 02/27/2023  4:55 PM EDT ----- Chem-7 looks good, proceed with coronary CT

## 2023-03-03 ENCOUNTER — Encounter: Payer: Self-pay | Admitting: Cardiology

## 2023-03-06 ENCOUNTER — Encounter: Payer: Self-pay | Admitting: Cardiology

## 2023-03-06 ENCOUNTER — Telehealth: Payer: Self-pay | Admitting: Cardiology

## 2023-03-06 NOTE — Telephone Encounter (Signed)
Patient calling for CT results. She also sent a mychart message and would like a call back instead of a response in mychart.

## 2023-03-06 NOTE — Telephone Encounter (Signed)
Called patient and she reported that she had read the interpretation of her cardiac CT results but was unsure what the interpretation means. Please see the interpretation below:  "Coronary artery disease present, only mild nonobstructive disease but still aggressive risk management need to be implemented"  She was asking what is meant by "aggressive risk management need to be implemented". Please advise

## 2023-03-10 ENCOUNTER — Other Ambulatory Visit: Payer: Self-pay

## 2023-03-10 DIAGNOSIS — R0609 Other forms of dyspnea: Secondary | ICD-10-CM

## 2023-03-10 DIAGNOSIS — E785 Hyperlipidemia, unspecified: Secondary | ICD-10-CM

## 2023-03-10 NOTE — Telephone Encounter (Signed)
Called patient and informed her below regarding Dr. Vanetta Shawl response to her question:  "Aspirin, that she is already on, she needs to have fasting lipid profile done and LP(a) to decide about medications to reduce cholesterol"  Patient was appreciative for the call and had no further questions at this time.

## 2023-03-14 DIAGNOSIS — Z860100 Personal history of colon polyps, unspecified: Secondary | ICD-10-CM | POA: Insufficient documentation

## 2023-03-14 DIAGNOSIS — R140 Abdominal distension (gaseous): Secondary | ICD-10-CM | POA: Insufficient documentation

## 2023-03-14 DIAGNOSIS — E785 Hyperlipidemia, unspecified: Secondary | ICD-10-CM

## 2023-03-18 ENCOUNTER — Encounter: Payer: Self-pay | Admitting: Cardiology

## 2023-03-18 ENCOUNTER — Ambulatory Visit: Payer: 59 | Attending: Cardiology | Admitting: Cardiology

## 2023-03-18 ENCOUNTER — Telehealth: Payer: Self-pay

## 2023-03-18 VITALS — BP 118/68 | HR 65 | Ht 63.0 in | Wt 110.0 lb

## 2023-03-18 DIAGNOSIS — I2510 Atherosclerotic heart disease of native coronary artery without angina pectoris: Secondary | ICD-10-CM

## 2023-03-18 DIAGNOSIS — I471 Supraventricular tachycardia, unspecified: Secondary | ICD-10-CM

## 2023-03-18 DIAGNOSIS — I7121 Aneurysm of the ascending aorta, without rupture: Secondary | ICD-10-CM | POA: Diagnosis not present

## 2023-03-18 DIAGNOSIS — R002 Palpitations: Secondary | ICD-10-CM | POA: Diagnosis not present

## 2023-03-18 DIAGNOSIS — R0789 Other chest pain: Secondary | ICD-10-CM

## 2023-03-18 NOTE — Progress Notes (Signed)
Cardiology Office Note:    Date:  03/18/2023   ID:  Linda Garrett, DOB [birth year], MRN [id number]  PCP:  Linda Floro, MD  Cardiologist:  Linda Balsam, MD    Referring MD: Linda Floro, MD   Chief Complaint  Patient presents with   CT results    Lab request     History of Present Illness:    Linda Garrett is a 62 y.o. female with past medical history significant for enlargement of the ascending aorta and, elevated calcium score of 49, recently she had coronary CT angio which showed 25 to 49% stenosis of the PDA as well as distal LAD.  Her blood volume was only 4 mm, which is 22%.  She comes today to talk about this.  Overall she is doing fine.  She is somewhat scared of all those findings.  The same time she is afraid to take some medications.  She denies have any chest pain tightness squeezing pressure burning chest.  She is active exercise on a regular basis.  She does not smoke.  Past Medical History:  Diagnosis Date   Arthralgia of both hands 01/21/2018   Ascending aortic aneurysm (HCC) 03/08/2020   Atypical chest pain 06/11/2019   Cervicogenic headache 02/04/2019   Chronic allergic rhinitis 12/14/2014   Colon cancer screening 10/23/2022   Coronary artery disease positive calcium score of 49 08/26/2019   Dyslipidemia 07/07/2019   Dyspnea on exertion 06/11/2019   Environmental allergies 07/24/2011   Family history of malignant neoplasm of digestive organs 10/23/2022   Gastroesophageal reflux disease 10/03/2020   Neck pain 02/04/2019   Palpitations 06/11/2019   Paresthesia 08/10/2021   Primary insomnia 10/03/2020   Rheumatoid arthritis (HCC) 02/26/2018   Seasonal allergic rhinitis 12/14/2014   SVT (supraventricular tachycardia) 07/07/2019   Tinnitus of left ear 10/03/2020   Trigger finger 02/26/2018   Vitamin D deficiency 10/03/2020    Past Surgical History:  Procedure Laterality Date   NO PAST SURGERIES      Current Medications: Current Meds   Medication Sig   diltiazem (CARDIZEM CD) 120 MG 24 hr capsule Take 1 capsule (120 mg total) by mouth daily.   diphenhydrAMINE (BENADRYL) 25 mg capsule Take 25 mg by mouth as needed for itching.   zolpidem (AMBIEN) 10 MG tablet Take 3 mg by mouth 3 (three) times a week.   [DISCONTINUED] albuterol (VENTOLIN HFA) 108 (90 Base) MCG/ACT inhaler Inhale 1 puff into the lungs every 4 (four) hours as needed for shortness of breath or wheezing.   [DISCONTINUED] aspirin EC 81 MG tablet Take 1 tablet (81 mg total) by mouth daily. Swallow whole.   [DISCONTINUED] metoprolol tartrate (LOPRESSOR) 100 MG tablet Take one tablet 2 hours before cardiac CT for heart greater than 55 (Patient taking differently: 2 (two) times daily. Take one tablet 2 hours before cardiac CT for heart greater than 55)     Allergies:   Tizanidine, Tramadol, Tizanidine hcl, Trazodone, and Erythromycin   Social History   Socioeconomic History   Marital status: Married    Spouse name: Not on file   Number of children: Not on file   Years of education: Not on file   Highest education level: Not on file  Occupational History   Not on file  Tobacco Use   Smoking status: Never   Smokeless tobacco: Never  Vaping Use   Vaping Use: Never used  Substance and Sexual Activity   Alcohol use: Yes   Drug use:  Never   Sexual activity: Not on file  Other Topics Concern   Not on file  Social History Narrative   Not on file   Social Determinants of Health   Financial Resource Strain: Not on file  Food Insecurity: Not on file  Transportation Needs: Not on file  Physical Activity: Not on file  Stress: Not on file  Social Connections: Not on file     Family History: The patient's family history includes Barrett's esophagus in her father; COPD in her father; Heart failure in her mother; Hypertension in her mother; Lymphoma in her father; Stroke in her mother. ROS:   Please see the history of present illness.    All 14 point review  of systems negative except as described per history of present illness  EKGs/Labs/Other Studies Reviewed:      Recent Labs: 02/24/2023: BUN 9; Creatinine, Ser 0.70; Potassium 3.9; Sodium 139  Recent Lipid Panel    Component Value Date/Time   CHOL 204 (H) 11/16/2019 0802   TRIG 96 11/16/2019 0802   HDL 75 11/16/2019 0802   CHOLHDL 2.7 11/16/2019 0802   LDLCALC 112 (H) 11/16/2019 0802    Physical Exam:    VS:  BP 118/68 (BP Location: Left Arm, Patient Position: Sitting)   Pulse 65   Ht 5\' 3"  (1.6 m)   Wt 110 lb (49.9 kg)   SpO2 95%   BMI 19.49 kg/m     Wt Readings from Last 3 Encounters:  03/18/23 110 lb (49.9 kg)  10/24/22 115 lb 1.3 oz (52.2 kg)  03/21/22 118 lb 12.8 oz (53.9 kg)     GEN:  Well nourished, well developed in no acute distress HEENT: Normal NECK: No JVD; No carotid bruits LYMPHATICS: No lymphadenopathy CARDIAC: RRR, no murmurs, no rubs, no gallops RESPIRATORY:  Clear to auscultation without rales, wheezing or rhonchi  ABDOMEN: Soft, non-tender, non-distended MUSCULOSKELETAL:  No edema; No deformity  SKIN: Warm and dry LOWER EXTREMITIES: no swelling NEUROLOGIC:  Alert and oriented x 3 PSYCHIATRIC:  Normal affect   ASSESSMENT:    1. Coronary artery disease involving native coronary artery of native heart without angina pectoris   2. SVT (supraventricular tachycardia)   3. Aneurysm of ascending aorta without rupture (HCC)   4. Palpitations   5. Atypical chest pain    PLAN:    In order of problems listed above:  Coronary artery disease likely only mild in distal LAD and PDA.  Elevated calcium score. Volume is relatively low however I recommend aggressive risk factors modifications.  Asked her to start taking 1 baby aspirin every single day she is reluctant she said she read so many things bad about aspirin I told her this probably the safest medication we have will be beneficial for her.  We also talk about potentially taking cholesterol medication.   She would like to have her cholesterol rechecked which I will do including LP(a). History of supraventricular tachycardia diltiazem seems to be controlling this arrhythmia quite nicely. History of ascending arctic aneurysm however CT of her chest did not show any significant enlargement of the aorta.  Can drop this issue. Palpitations stable   Medication Adjustments/Labs and Tests Ordered: Current medicines are reviewed at length with the patient today.  Concerns regarding medicines are outlined above.  No orders of the defined types were placed in this encounter.  Medication changes: No orders of the defined types were placed in this encounter.   Signed, Georgeanna Lea, MD, [facility] 03/18/2023 10:12  AM    [facility]

## 2023-03-18 NOTE — Patient Instructions (Signed)
Medication Instructions:  Your physician recommends that you continue on your current medications as directed. Please refer to the Current Medication list given to you today.  *If you need a refill on your cardiac medications before your next appointment, please call your pharmacy*   Lab Work: Lipid panel, Lpa- already ordered previously If you have labs (blood work) drawn today and your tests are completely normal, you will receive your results only by: MyChart Message (if you have MyChart) OR A paper copy in the mail If you have any lab test that is abnormal or we need to change your treatment, we will call you to review the results.   Testing/Procedures: None Ordered   Follow-Up: At [facility], you and your health needs are our priority.  As part of our continuing mission to provide you with exceptional heart care, we have created designated Provider Care Teams.  These Care Teams include your primary Cardiologist (physician) and Advanced Practice Providers (APPs -  Physician Assistants and Nurse Practitioners) who all work together to provide you with the care you need, when you need it.  We recommend signing up for the patient portal called "MyChart".  Sign up information is provided on this After Visit Summary.  MyChart is used to connect with patients for Virtual Visits (Telemedicine).  Patients are able to view lab/test results, encounter notes, upcoming appointments, etc.  Non-urgent messages can be sent to your provider as well.   To learn more about what you can do with MyChart, go to [email or web address].    Your next appointment:   6 month(s)  The format for your next appointment:   In Person  Provider:   Gypsy Balsam, MD    Other Instructions NA

## 2023-03-18 NOTE — Telephone Encounter (Signed)
Viewed results ion My Chart per Dr. Vanetta Shawl note. Has appt to discuss further testing on  03-18-23. Routed to PCP

## 2023-03-19 LAB — LIPID PANEL
Chol/HDL Ratio: 2.4 ratio (ref 0.0–4.4)
Cholesterol, Total: 200 mg/dL — ABNORMAL HIGH (ref 100–199)
HDL: 82 mg/dL (ref >39)
LDL Chol Calc (NIH): 98 mg/dL (ref 0–99)
Triglycerides: 117 mg/dL (ref 0–149)
VLDL Cholesterol Cal: 20 mg/dL (ref 5–40)

## 2023-03-19 LAB — LIPOPROTEIN A (LPA): Lipoprotein (a): 75.6 nmol/L — ABNORMAL HIGH (ref <75.0)

## 2023-03-21 ENCOUNTER — Telehealth: Payer: Self-pay

## 2023-03-21 NOTE — Telephone Encounter (Signed)
Left message on My Chart with normal results per Dr. Krasowski's note. Routed to PCP. 

## 2023-03-21 NOTE — Telephone Encounter (Signed)
Pt viewed results in My Chart per Dr. Krasowski's note. Routed to PCP.  

## 2023-05-21 ENCOUNTER — Other Ambulatory Visit: Payer: Self-pay | Admitting: Cardiology

## 2023-07-22 ENCOUNTER — Telehealth: Payer: Self-pay | Admitting: Cardiology

## 2023-07-22 ENCOUNTER — Other Ambulatory Visit: Payer: Self-pay

## 2023-07-22 MED ORDER — DILTIAZEM HCL ER COATED BEADS 120 MG PO CP24: 120.0000 mg | ORAL_CAPSULE | Freq: Every day | ORAL | 3 refills | Status: DC

## 2023-07-22 NOTE — Telephone Encounter (Signed)
Pt calling to wanting to know which cholesterol medication Dr. Bing Matter wanted her on. She will send message if she decides to start Lipitor.

## 2023-07-22 NOTE — Telephone Encounter (Signed)
Refilled Cardizem CD 120 #90 ref x 3. Sent to Mail order [facility]

## 2023-07-22 NOTE — Telephone Encounter (Signed)
*  STAT* If patient is at the pharmacy, call can be transferred to refill team.   1. Which medications need to be refilled? (please list name of each medication and dose if known) diltiazem (CARDIZEM CD) 120 MG 24 hr capsule   2. Which pharmacy/location (including street and city if local pharmacy) is medication to be sent to?  [facility] [place] [place] to [facility]   3. Do they need a 30 day or 90 day supply? [place]

## 2023-07-22 NOTE — Telephone Encounter (Signed)
Patient calling in about cholesterol medication. Please advise

## 2023-11-04 DIAGNOSIS — Z7189 Other specified counseling: Secondary | ICD-10-CM | POA: Diagnosis not present

## 2023-11-04 DIAGNOSIS — Z6820 Body mass index (BMI) 20.0-20.9, adult: Secondary | ICD-10-CM | POA: Diagnosis not present

## 2023-11-04 DIAGNOSIS — F5101 Primary insomnia: Secondary | ICD-10-CM | POA: Diagnosis not present

## 2023-11-05 DIAGNOSIS — M9903 Segmental and somatic dysfunction of lumbar region: Secondary | ICD-10-CM | POA: Diagnosis not present

## 2023-11-05 DIAGNOSIS — M5417 Radiculopathy, lumbosacral region: Secondary | ICD-10-CM | POA: Diagnosis not present

## 2023-11-05 DIAGNOSIS — M9901 Segmental and somatic dysfunction of cervical region: Secondary | ICD-10-CM | POA: Diagnosis not present

## 2023-11-05 DIAGNOSIS — M5032 Other cervical disc degeneration, mid-cervical region, unspecified level: Secondary | ICD-10-CM | POA: Diagnosis not present

## 2023-11-05 DIAGNOSIS — M9902 Segmental and somatic dysfunction of thoracic region: Secondary | ICD-10-CM | POA: Diagnosis not present

## 2023-11-05 DIAGNOSIS — M9905 Segmental and somatic dysfunction of pelvic region: Secondary | ICD-10-CM | POA: Diagnosis not present

## 2023-11-05 DIAGNOSIS — M62830 Muscle spasm of back: Secondary | ICD-10-CM | POA: Diagnosis not present

## 2023-11-05 DIAGNOSIS — M531 Cervicobrachial syndrome: Secondary | ICD-10-CM | POA: Diagnosis not present

## 2024-01-07 DIAGNOSIS — L578 Other skin changes due to chronic exposure to nonionizing radiation: Secondary | ICD-10-CM | POA: Diagnosis not present

## 2024-01-07 DIAGNOSIS — L814 Other melanin hyperpigmentation: Secondary | ICD-10-CM | POA: Diagnosis not present

## 2024-01-07 DIAGNOSIS — D225 Melanocytic nevi of trunk: Secondary | ICD-10-CM | POA: Diagnosis not present

## 2024-01-07 DIAGNOSIS — L570 Actinic keratosis: Secondary | ICD-10-CM | POA: Diagnosis not present

## 2024-01-07 DIAGNOSIS — B078 Other viral warts: Secondary | ICD-10-CM | POA: Diagnosis not present

## 2024-01-07 DIAGNOSIS — L820 Inflamed seborrheic keratosis: Secondary | ICD-10-CM | POA: Diagnosis not present

## 2024-01-21 ENCOUNTER — Ambulatory Visit: Payer: Self-pay | Attending: Cardiology | Admitting: Cardiology

## 2024-01-21 ENCOUNTER — Encounter: Payer: Self-pay | Admitting: Cardiology

## 2024-01-21 VITALS — BP 118/76 | HR 84 | Ht 63.0 in | Wt 113.0 lb

## 2024-01-21 DIAGNOSIS — E785 Hyperlipidemia, unspecified: Secondary | ICD-10-CM

## 2024-01-21 DIAGNOSIS — I2510 Atherosclerotic heart disease of native coronary artery without angina pectoris: Secondary | ICD-10-CM

## 2024-01-21 DIAGNOSIS — R002 Palpitations: Secondary | ICD-10-CM

## 2024-01-21 DIAGNOSIS — I471 Supraventricular tachycardia, unspecified: Secondary | ICD-10-CM

## 2024-01-21 NOTE — Addendum Note (Signed)
 Addended by: Baldo Ash D on: 01/21/2024 02:57 PM   Modules accepted: Orders

## 2024-01-21 NOTE — Progress Notes (Signed)
 Cardiology Office Note:    Date:  01/21/2024   ID:  Linda Garrett, DOB 30-Jun-1961, MRN [id number]  PCP:  Daisy Floro, MD  Cardiologist:  Gypsy Balsam, MD    Referring MD: Daisy Floro, MD   Chief Complaint  Patient presents with   Chest Pain    History of Present Illness:    Linda Garrett is a 63 y.o. female past medical history significant for elevated calcium score, coronary CT angio showed 25 to 49% stenosis of the PDA as well as distal LAD, plaque volume was only 4 which is 20%.  Comes today to months for follow-up additional problem of supraventricular tachycardia that she did not mention diltiazem.  Overall doing well exercise on the regular basis for 5 times a week elliptical treadmill from approximately 45 minutes".  Denies have any chest pain tightness squeezing pressure when chest  Past Medical History:  Diagnosis Date   Arthralgia of both hands 01/21/2018   Ascending aortic aneurysm (HCC) 03/08/2020   Atypical chest pain 06/11/2019   Cervicogenic headache 02/04/2019   Chronic allergic rhinitis 12/14/2014   Colon cancer screening 10/23/2022   Coronary artery disease positive calcium score of 49 08/26/2019   Dyslipidemia 07/07/2019   Dyspnea on exertion 06/11/2019   Environmental allergies 07/24/2011   Family history of malignant neoplasm of digestive organs 10/23/2022   Gastroesophageal reflux disease 10/03/2020   Neck pain 02/04/2019   Palpitations 06/11/2019   Paresthesia 08/10/2021   Primary insomnia 10/03/2020   Rheumatoid arthritis (HCC) 02/26/2018   Seasonal allergic rhinitis 12/14/2014   SVT (supraventricular tachycardia) (HCC) 07/07/2019   Tinnitus of left ear 10/03/2020   Trigger finger 02/26/2018   Vitamin D deficiency 10/03/2020    Past Surgical History:  Procedure Laterality Date   NO PAST SURGERIES      Current Medications: Current Meds  Medication Sig   diltiazem (CARDIZEM CD) 120 MG 24 hr capsule Take 1 capsule (120 mg  total) by mouth daily.   diphenhydrAMINE (BENADRYL) 25 mg capsule Take 25 mg by mouth as needed for itching.   zolpidem (AMBIEN) 10 MG tablet Take 3 mg by mouth 3 (three) times a week.     Allergies:   Tizanidine, Tramadol, Tizanidine hcl, Trazodone, and Erythromycin   Social History   Socioeconomic History   Marital status: Married    Spouse name: Not on file   Number of children: Not on file   Years of education: Not on file   Highest education level: Not on file  Occupational History   Not on file  Tobacco Use   Smoking status: Never   Smokeless tobacco: Never  Vaping Use   Vaping status: Never Used  Substance and Sexual Activity   Alcohol use: Yes   Drug use: Never   Sexual activity: Not on file  Other Topics Concern   Not on file  Social History Narrative   Not on file   Social Drivers of Health   Financial Resource Strain: Not on file  Food Insecurity: Not on file  Transportation Needs: Not on file  Physical Activity: Not on file  Stress: Not on file  Social Connections: Unknown (03/03/2022)   Received from [facility]   Social Network    Social Network: Not on file     Family History: The patient's family history includes Barrett's esophagus in her father; COPD in her father; Heart failure in her mother; Hypertension in her mother; Lymphoma in her father; Stroke  in her mother. ROS:   Please see the history of present illness.    All 14 point review of systems negative except as described per history of present illness  EKGs/Labs/Other Studies Reviewed:    EKG Interpretation Date/Time:  Wednesday January 21 2024 14:24:04 EDT Ventricular Rate:  78 PR Interval:  140 QRS Duration:  74 QT Interval:  348 QTC Calculation: 396 R Axis:   80  Text Interpretation: Normal sinus rhythm Nonspecific ST abnormality No previous ECGs available Confirmed by Gypsy Balsam [phone]) on 01/21/2024 2:33:57 PM    Recent Labs: 02/24/2023: BUN 9; Creatinine,  Ser 0.70; Potassium 3.9; Sodium 139  Recent Lipid Panel    Component Value Date/Time   CHOL 200 (H) 03/18/2023 1027   TRIG 117 03/18/2023 1027   HDL 82 03/18/2023 1027   CHOLHDL 2.4 03/18/2023 1027   LDLCALC 98 03/18/2023 1027    Physical Exam:    VS:  BP 118/76 (BP Location: Right Arm, Patient Position: Sitting)   Pulse 84   Ht 5\' 3"  (1.6 m)   Wt 113 lb (51.3 kg)   SpO2 99%   BMI 20.02 kg/m     Wt Readings from Last 3 Encounters:  01/21/24 113 lb (51.3 kg)  03/18/23 110 lb (49.9 kg)  10/24/22 115 lb 1.3 oz (52.2 kg)     GEN:  Well nourished, well developed in no acute distress HEENT: Normal NECK: No JVD; No carotid bruits LYMPHATICS: No lymphadenopathy CARDIAC: RRR, no murmurs, no rubs, no gallops RESPIRATORY:  Clear to auscultation without rales, wheezing or rhonchi  ABDOMEN: Soft, non-tender, non-distended MUSCULOSKELETAL:  No edema; No deformity  SKIN: Warm and dry LOWER EXTREMITIES: no swelling NEUROLOGIC:  Alert and oriented x 3 PSYCHIATRIC:  Normal affect   ASSESSMENT:    1. Palpitations   2. SVT (supraventricular tachycardia) (HCC)   3. Coronary artery disease involving native coronary artery of native heart without angina pectoris   4. Dyslipidemia    PLAN:    In order of problems listed above:  Calcium score elevation with some nonobstructive disease.  Long discussion about what to do this duration maybe try aspirin that she could not tolerated we talked potentially about taking Plavix, she prefers to have her fasting ibuprofen done to decide, Dyslipidemia will check fasting lipid profile with LP(a) last past week profile LDL 98 HDL 82.  Then will decide about management. Palpitations she said she does have manage she is willing to temporarily stop diltiazem to see if there is any recurrences of arrhythmia if not we will continue with if she will have some) then diltiazem will be restarted   Medication Adjustments/Labs and Tests Ordered: Current  medicines are reviewed at length with the patient today.  Concerns regarding medicines are outlined above.  Orders Placed This Encounter  Procedures   EKG 12-Lead   Medication changes: No orders of the defined types were placed in this encounter.   Signed, Georgeanna Lea, MD, [facility] 01/21/2024 2:48 PM    [facility]

## 2024-01-21 NOTE — Patient Instructions (Addendum)
Medication Instructions:  Your physician recommends that you continue on your current medications as directed. Please refer to the Current Medication list given to you today.  *If you need a refill on your cardiac medications before your next appointment, please call your pharmacy*   Lab Work: 3rd Floor   [address]  Your physician recommends that you return for lab work in: when fasting   You need to have labs done when you are fasting.  You can come Monday through Friday 8:00 am to 11:30AM and 1:00 to 4:00. You do not need to make an appointment as the order has already been placed.    Testing/Procedures: None Ordered   Follow-Up: At [facility], you and your health needs are our priority.  As part of our continuing mission to provide you with exceptional heart care, we have created designated Provider Care Teams.  These Care Teams include your primary Cardiologist (physician) and Advanced Practice Providers (APPs -  Physician Assistants and Nurse Practitioners) who all work together to provide you with the care you need, when you need it.  We recommend signing up for the patient portal called "MyChart".  Sign up information is provided on this After Visit Summary.  MyChart is used to connect with patients for Virtual Visits (Telemedicine).  Patients are able to view lab/test results, encounter notes, upcoming appointments, etc.  Non-urgent messages can be sent to your provider as well.   To learn more about what you can do with MyChart, go to [email or web address].    Your next appointment:   12 month(s)  The format for your next appointment:   In Person  Provider:   Gypsy Balsam, MD    Other Instructions NA

## 2024-01-27 LAB — LIPID PANEL
Chol/HDL Ratio: 2.6 ratio (ref 0.0–4.4)
Cholesterol, Total: 199 mg/dL (ref 100–199)
HDL: 78 mg/dL (ref >39)
LDL Chol Calc (NIH): 108 mg/dL — ABNORMAL HIGH (ref 0–99)
Triglycerides: 73 mg/dL (ref 0–149)
VLDL Cholesterol Cal: 13 mg/dL (ref 5–40)

## 2024-01-27 LAB — LIPOPROTEIN A (LPA): Lipoprotein (a): 81.6 nmol/L — ABNORMAL HIGH (ref <75.0)

## 2024-01-28 ENCOUNTER — Telehealth: Payer: Self-pay | Admitting: Cardiology

## 2024-01-28 NOTE — Telephone Encounter (Signed)
Patient called to follow-up on test results. 

## 2024-01-28 NOTE — Telephone Encounter (Signed)
 LVM that labs have not been reviewed by Dr. Bing Matter yet and we would call or send a My Chart note when available

## 2024-01-30 ENCOUNTER — Telehealth: Payer: Self-pay

## 2024-01-30 DIAGNOSIS — E785 Hyperlipidemia, unspecified: Secondary | ICD-10-CM

## 2024-01-30 MED ORDER — ATORVASTATIN CALCIUM 10 MG PO TABS: 10.0000 mg | ORAL_TABLET | Freq: Every day | ORAL | 3 refills | Status: DC

## 2024-01-30 NOTE — Telephone Encounter (Signed)
 Left message on My Chart with lab results per Dr. Vanetta Shawl note. Routed to PCP.

## 2024-02-02 ENCOUNTER — Telehealth: Payer: Self-pay

## 2024-02-02 NOTE — Telephone Encounter (Signed)
 Pt viewed lab results on My Chart per Dr. Vanetta Shawl note. Routed to PCP.

## 2024-02-11 DIAGNOSIS — Z01419 Encounter for gynecological examination (general) (routine) without abnormal findings: Secondary | ICD-10-CM | POA: Diagnosis not present

## 2024-02-11 DIAGNOSIS — Z1231 Encounter for screening mammogram for malignant neoplasm of breast: Secondary | ICD-10-CM | POA: Diagnosis not present

## 2024-02-11 DIAGNOSIS — Z681 Body mass index (BMI) 19 or less, adult: Secondary | ICD-10-CM | POA: Diagnosis not present

## 2024-02-12 DIAGNOSIS — M9901 Segmental and somatic dysfunction of cervical region: Secondary | ICD-10-CM | POA: Diagnosis not present

## 2024-02-12 DIAGNOSIS — M5417 Radiculopathy, lumbosacral region: Secondary | ICD-10-CM | POA: Diagnosis not present

## 2024-02-12 DIAGNOSIS — M5032 Other cervical disc degeneration, mid-cervical region, unspecified level: Secondary | ICD-10-CM | POA: Diagnosis not present

## 2024-02-12 DIAGNOSIS — M9902 Segmental and somatic dysfunction of thoracic region: Secondary | ICD-10-CM | POA: Diagnosis not present

## 2024-02-12 DIAGNOSIS — M531 Cervicobrachial syndrome: Secondary | ICD-10-CM | POA: Diagnosis not present

## 2024-02-12 DIAGNOSIS — M62830 Muscle spasm of back: Secondary | ICD-10-CM | POA: Diagnosis not present

## 2024-02-12 DIAGNOSIS — M9905 Segmental and somatic dysfunction of pelvic region: Secondary | ICD-10-CM | POA: Diagnosis not present

## 2024-02-12 DIAGNOSIS — M9903 Segmental and somatic dysfunction of lumbar region: Secondary | ICD-10-CM | POA: Diagnosis not present

## 2024-02-16 ENCOUNTER — Other Ambulatory Visit: Payer: Self-pay | Admitting: Obstetrics and Gynecology

## 2024-02-16 DIAGNOSIS — R928 Other abnormal and inconclusive findings on diagnostic imaging of breast: Secondary | ICD-10-CM

## 2024-02-20 ENCOUNTER — Ambulatory Visit
Admission: RE | Admit: 2024-02-20 | Discharge: 2024-02-20 | Disposition: A | Discharge status: Home / Self Care | Source: Ambulatory Visit | Attending: Obstetrics and Gynecology | Admitting: Obstetrics and Gynecology

## 2024-02-20 ENCOUNTER — Ambulatory Visit

## 2024-02-20 DIAGNOSIS — R928 Other abnormal and inconclusive findings on diagnostic imaging of breast: Secondary | ICD-10-CM | POA: Diagnosis not present

## 2024-02-27 ENCOUNTER — Other Ambulatory Visit

## 2024-02-27 ENCOUNTER — Encounter

## 2024-04-01 ENCOUNTER — Ambulatory Visit: Admitting: Cardiology

## 2024-06-01 DIAGNOSIS — M9902 Segmental and somatic dysfunction of thoracic region: Secondary | ICD-10-CM | POA: Diagnosis not present

## 2024-06-01 DIAGNOSIS — M5032 Other cervical disc degeneration, mid-cervical region, unspecified level: Secondary | ICD-10-CM | POA: Diagnosis not present

## 2024-06-01 DIAGNOSIS — M9905 Segmental and somatic dysfunction of pelvic region: Secondary | ICD-10-CM | POA: Diagnosis not present

## 2024-06-01 DIAGNOSIS — M5386 Other specified dorsopathies, lumbar region: Secondary | ICD-10-CM | POA: Diagnosis not present

## 2024-06-01 DIAGNOSIS — M9903 Segmental and somatic dysfunction of lumbar region: Secondary | ICD-10-CM | POA: Diagnosis not present

## 2024-06-01 DIAGNOSIS — M62830 Muscle spasm of back: Secondary | ICD-10-CM | POA: Diagnosis not present

## 2024-06-01 DIAGNOSIS — M9901 Segmental and somatic dysfunction of cervical region: Secondary | ICD-10-CM | POA: Diagnosis not present

## 2024-06-08 DIAGNOSIS — M9902 Segmental and somatic dysfunction of thoracic region: Secondary | ICD-10-CM | POA: Diagnosis not present

## 2024-06-08 DIAGNOSIS — M5032 Other cervical disc degeneration, mid-cervical region, unspecified level: Secondary | ICD-10-CM | POA: Diagnosis not present

## 2024-06-08 DIAGNOSIS — M9905 Segmental and somatic dysfunction of pelvic region: Secondary | ICD-10-CM | POA: Diagnosis not present

## 2024-06-08 DIAGNOSIS — M9901 Segmental and somatic dysfunction of cervical region: Secondary | ICD-10-CM | POA: Diagnosis not present

## 2024-06-08 DIAGNOSIS — M5386 Other specified dorsopathies, lumbar region: Secondary | ICD-10-CM | POA: Diagnosis not present

## 2024-06-08 DIAGNOSIS — M9903 Segmental and somatic dysfunction of lumbar region: Secondary | ICD-10-CM | POA: Diagnosis not present

## 2024-06-08 DIAGNOSIS — M62830 Muscle spasm of back: Secondary | ICD-10-CM | POA: Diagnosis not present

## 2024-06-10 DIAGNOSIS — M62830 Muscle spasm of back: Secondary | ICD-10-CM | POA: Diagnosis not present

## 2024-06-10 DIAGNOSIS — M9901 Segmental and somatic dysfunction of cervical region: Secondary | ICD-10-CM | POA: Diagnosis not present

## 2024-06-10 DIAGNOSIS — M5032 Other cervical disc degeneration, mid-cervical region, unspecified level: Secondary | ICD-10-CM | POA: Diagnosis not present

## 2024-06-10 DIAGNOSIS — M9902 Segmental and somatic dysfunction of thoracic region: Secondary | ICD-10-CM | POA: Diagnosis not present

## 2024-06-10 DIAGNOSIS — M5386 Other specified dorsopathies, lumbar region: Secondary | ICD-10-CM | POA: Diagnosis not present

## 2024-06-10 DIAGNOSIS — M9905 Segmental and somatic dysfunction of pelvic region: Secondary | ICD-10-CM | POA: Diagnosis not present

## 2024-06-10 DIAGNOSIS — M9903 Segmental and somatic dysfunction of lumbar region: Secondary | ICD-10-CM | POA: Diagnosis not present

## 2024-06-15 DIAGNOSIS — M9905 Segmental and somatic dysfunction of pelvic region: Secondary | ICD-10-CM | POA: Diagnosis not present

## 2024-06-15 DIAGNOSIS — M9901 Segmental and somatic dysfunction of cervical region: Secondary | ICD-10-CM | POA: Diagnosis not present

## 2024-06-15 DIAGNOSIS — M9903 Segmental and somatic dysfunction of lumbar region: Secondary | ICD-10-CM | POA: Diagnosis not present

## 2024-06-15 DIAGNOSIS — M9902 Segmental and somatic dysfunction of thoracic region: Secondary | ICD-10-CM | POA: Diagnosis not present

## 2024-06-15 DIAGNOSIS — M62830 Muscle spasm of back: Secondary | ICD-10-CM | POA: Diagnosis not present

## 2024-06-15 DIAGNOSIS — M5032 Other cervical disc degeneration, mid-cervical region, unspecified level: Secondary | ICD-10-CM | POA: Diagnosis not present

## 2024-06-15 DIAGNOSIS — M5386 Other specified dorsopathies, lumbar region: Secondary | ICD-10-CM | POA: Diagnosis not present

## 2024-07-12 ENCOUNTER — Other Ambulatory Visit: Payer: Self-pay | Admitting: Cardiology

## 2024-08-11 DIAGNOSIS — M62830 Muscle spasm of back: Secondary | ICD-10-CM | POA: Diagnosis not present

## 2024-08-11 DIAGNOSIS — M9901 Segmental and somatic dysfunction of cervical region: Secondary | ICD-10-CM | POA: Diagnosis not present

## 2024-08-11 DIAGNOSIS — M9903 Segmental and somatic dysfunction of lumbar region: Secondary | ICD-10-CM | POA: Diagnosis not present

## 2024-08-11 DIAGNOSIS — M5032 Other cervical disc degeneration, mid-cervical region, unspecified level: Secondary | ICD-10-CM | POA: Diagnosis not present

## 2024-08-11 DIAGNOSIS — M9905 Segmental and somatic dysfunction of pelvic region: Secondary | ICD-10-CM | POA: Diagnosis not present

## 2024-08-11 DIAGNOSIS — M5386 Other specified dorsopathies, lumbar region: Secondary | ICD-10-CM | POA: Diagnosis not present

## 2024-08-11 DIAGNOSIS — M9902 Segmental and somatic dysfunction of thoracic region: Secondary | ICD-10-CM | POA: Diagnosis not present

## 2024-08-26 ENCOUNTER — Encounter: Payer: Self-pay | Admitting: Cardiology

## 2024-08-26 DIAGNOSIS — B029 Zoster without complications: Secondary | ICD-10-CM | POA: Diagnosis not present

## 2024-09-20 ENCOUNTER — Ambulatory Visit: Attending: Cardiology | Admitting: Cardiology

## 2024-09-20 ENCOUNTER — Telehealth: Payer: Self-pay | Admitting: Cardiology

## 2024-09-20 ENCOUNTER — Encounter: Payer: Self-pay | Admitting: *Deleted

## 2024-09-20 ENCOUNTER — Encounter: Payer: Self-pay | Admitting: Cardiology

## 2024-09-20 VITALS — BP 130/70 | HR 79 | Ht 63.0 in | Wt 113.0 lb

## 2024-09-20 DIAGNOSIS — Z800 Family history of malignant neoplasm of digestive organs: Secondary | ICD-10-CM | POA: Insufficient documentation

## 2024-09-20 DIAGNOSIS — I471 Supraventricular tachycardia, unspecified: Secondary | ICD-10-CM

## 2024-09-20 DIAGNOSIS — R002 Palpitations: Secondary | ICD-10-CM

## 2024-09-20 DIAGNOSIS — R0789 Other chest pain: Secondary | ICD-10-CM | POA: Diagnosis not present

## 2024-09-20 DIAGNOSIS — R5383 Other fatigue: Secondary | ICD-10-CM

## 2024-09-20 DIAGNOSIS — E785 Hyperlipidemia, unspecified: Secondary | ICD-10-CM | POA: Diagnosis not present

## 2024-09-20 DIAGNOSIS — I2510 Atherosclerotic heart disease of native coronary artery without angina pectoris: Secondary | ICD-10-CM | POA: Diagnosis not present

## 2024-09-20 NOTE — Telephone Encounter (Signed)
 Pt is considering having a heart monitor ordered. She would like to know the CPT code for it so she can ask her insurance how much it would be. Please advise.

## 2024-09-20 NOTE — Patient Instructions (Signed)
 Medication Instructions:  Your physician recommends that you continue on your current medications as directed. Please refer to the Current Medication list given to you today.  *If you need a refill on your cardiac medications before your next appointment, please call your pharmacy*   Lab Work: 3rd Floor   [address]  Your physician recommends that you return for lab work in:    You need to have labs done when you are fasting.  You can come Monday through Friday 8:00 am to 11:30AM and 1:00 to 4:00. You do not need to make an appointment as the order has already been placed.     Testing/Procedures: None Ordered   Follow-Up: At [facility], you and your health needs are our priority.  As part of our continuing mission to provide you with exceptional heart care, we have created designated Provider Care Teams.  These Care Teams include your primary Cardiologist (physician) and Advanced Practice Providers (APPs -  Physician Assistants and Nurse Practitioners) who all work together to provide you with the care you need, when you need it.  We recommend signing up for the patient portal called "MyChart".  Sign up information is provided on this After Visit Summary.  MyChart is used to connect with patients for Virtual Visits (Telemedicine).  Patients are able to view lab/test results, encounter notes, upcoming appointments, etc.  Non-urgent messages can be sent to your provider as well.   To learn more about what you can do with MyChart, go to [email or web address].    Your next appointment:   6 month(s)  The format for your next appointment:   In Person  Provider:   Gypsy Balsam, MD    Other Instructions NA

## 2024-09-20 NOTE — Progress Notes (Unsigned)
 Cardiology Office Note:    Date:  09/20/2024   ID:  Linda Garrett, DOB Mar 25, 1961, MRN [id number]  PCP:  Okey Carlin Redbird, MD  Cardiologist:  Lamar Fitch, MD    Referring MD: Okey Carlin Redbird, MD   Chief Complaint  Patient presents with   Follow-up    History of Present Illness:    Linda Garrett is a 63 y.o. female past medical history significant for elevated calcium  score 49, supraventricular tachycardia, dyslipidemia.  Comes today to months for follow-up.  For last few weeks she has experienced [place] more palpitations.  Apparently her daughter developed anxiety and panic disorder which is new discovery which stresses her out a lot she does have a lot of palpitations some sustained arrhythmia some skipped beats.  Lately a little bit better.  Also she did not take aspirin  as advised, also we talked about taking cholesterol medication she did not want to do that.  Otherwise seems to be doing fine overall little bit better in terms of palpitations  Past Medical History:  Diagnosis Date   Arthralgia of both hands 01/21/2018   Ascending aortic aneurysm 03/08/2020   Atypical chest pain 06/11/2019   Cervicogenic headache 02/04/2019   Chronic allergic rhinitis 12/14/2014   Colon cancer screening 10/23/2022   Coronary artery disease positive calcium  score of 49 08/26/2019   Dyslipidemia 07/07/2019   Dyspnea on exertion 06/11/2019   Environmental allergies 07/24/2011   Family history of malignant neoplasm of digestive organs 10/23/2022   Gastroesophageal reflux disease 10/03/2020   Neck pain 02/04/2019   Palpitations 06/11/2019   Paresthesia 08/10/2021   Primary insomnia 10/03/2020   Rheumatoid arthritis (HCC) 02/26/2018   Seasonal allergic rhinitis 12/14/2014   SVT (supraventricular tachycardia) 07/07/2019   Tinnitus of left ear 10/03/2020   Trigger finger 02/26/2018   Vitamin D deficiency 10/03/2020    Past Surgical History:  Procedure Laterality Date   NO PAST  SURGERIES      Current Medications: Current Meds  Medication Sig   diltiazem  (CARDIZEM  CD) 120 MG 24 hr capsule TAKE 1 CAPSULE BY MOUTH EVERY DAY   diphenhydrAMINE (BENADRYL) 25 mg capsule Take 25 mg by mouth as needed for itching.   zolpidem (AMBIEN) 10 MG tablet Take 3 mg by mouth 3 (three) times a week.     Allergies:   Tizanidine, Tramadol, Tizanidine hcl, Trazodone, and Erythromycin   Social History   Socioeconomic History   Marital status: Married    Spouse name: Not on file   Number of children: Not on file   Years of education: Not on file   Highest education level: Not on file  Occupational History   Not on file  Tobacco Use   Smoking status: Never   Smokeless tobacco: Never  Vaping Use   Vaping status: Never Used  Substance and Sexual Activity   Alcohol use: Yes   Drug use: Never   Sexual activity: Not on file  Other Topics Concern   Not on file  Social History Narrative   Not on file   Social Drivers of Health   Financial Resource Strain: Not on file  Food Insecurity: Not on file  Transportation Needs: Not on file  Physical Activity: Not on file  Stress: Not on file  Social Connections: Unknown (03/03/2022)   Received from [facility]   Social Network    Social Network: Not on file     Family History: The patient's family history includes Barrett's esophagus in her father;  COPD in her father; Heart failure in her mother; Hypertension in her mother; Lymphoma in her father; Stroke in her mother. ROS:   Please see the history of present illness.    All 14 point review of systems negative except as described per history of present illness  EKGs/Labs/Other Studies Reviewed:    EKG Interpretation Date/Time:  Monday September 20 2024 11:18:11 EST Ventricular Rate:  75 PR Interval:  134 QRS Duration:  74 QT Interval:  352 QTC Calculation: 393 R Axis:   82  Text Interpretation: Normal sinus rhythm Nonspecific ST abnormality When compared with ECG  of 21-Jan-2024 14:24, No significant change was found Confirmed by Bernie Charleston [phone]) on 09/20/2024 11:21:17 AM    Recent Labs: No results found for requested labs within last 365 days.  Recent Lipid Panel    Component Value Date/Time   CHOL 199 01/23/2024 0807   TRIG 73 01/23/2024 0807   HDL 78 01/23/2024 0807   CHOLHDL 2.6 01/23/2024 0807   LDLCALC 108 (H) 01/23/2024 0807    Physical Exam:    VS:  BP 130/70   Pulse 79   Ht 5' 3 (1.6 m)   Wt 113 lb (51.3 kg)   SpO2 99%   BMI 20.02 kg/m     Wt Readings from Last 3 Encounters:  09/20/24 113 lb (51.3 kg)  01/21/24 113 lb (51.3 kg)  03/18/23 110 lb (49.9 kg)     GEN:  Well nourished, well developed in no acute distress HEENT: Normal NECK: No JVD; No carotid bruits LYMPHATICS: No lymphadenopathy CARDIAC: RRR, no murmurs, no rubs, no gallops RESPIRATORY:  Clear to auscultation without rales, wheezing or rhonchi  ABDOMEN: Soft, non-tender, non-distended MUSCULOSKELETAL:  No edema; No deformity  SKIN: Warm and dry LOWER EXTREMITIES: no swelling NEUROLOGIC:  Alert and oriented x 3 PSYCHIATRIC:  Normal affect   ASSESSMENT:    1. Coronary artery disease involving native coronary artery of native heart without angina pectoris   2. Dyslipidemia   3. Fatigue, unspecified type   4. SVT (supraventricular tachycardia)   5. Atypical chest pain   6. Palpitations    PLAN:    In order of problems listed above:  Palpitations: We did talk about options for the situation option being simply increase dose of Cardizem  from 120-180, another option probably the best option will be to put a monitor on see exactly what is going on, or simply monitoring the situation since palpitations seems to be getting [place] better.  She said she will think about her medication and let me know which way she would like to go. Coronary disease likely no symptoms will suggest obstruction but I would recommend antiplatelets therapy she said she  will think it over, also will repeat her cholesterol to modify risk factors we had a long discussion about cholesterol medication hopefully she will be able to take it.  But she want to wait for results of fasting lipid profile because before making decision. Atypical chest pain denies having any   Medication Adjustments/Labs and Tests Ordered: Current medicines are reviewed at length with the patient today.  Concerns regarding medicines are outlined above.  Orders Placed This Encounter  Procedures   Lipid panel   Lipoprotein A (LPA)   CBC   Comprehensive metabolic panel with GFR   TSH   EKG 12-Lead   Medication changes: No orders of the defined types were placed in this encounter.   Signed, [place] DOROTHA Bernie, MD, [facility] 09/20/2024 12:24 PM  [facility]

## 2024-09-21 NOTE — Telephone Encounter (Signed)
 Spoke with patient about CPT codes.  Pt has no more questions.

## 2024-09-24 ENCOUNTER — Ambulatory Visit: Payer: Self-pay | Admitting: Cardiology

## 2024-09-24 ENCOUNTER — Telehealth: Payer: Self-pay

## 2024-09-24 DIAGNOSIS — R002 Palpitations: Secondary | ICD-10-CM

## 2024-09-24 DIAGNOSIS — E785 Hyperlipidemia, unspecified: Secondary | ICD-10-CM

## 2024-09-24 LAB — LIPID PANEL
Chol/HDL Ratio: 2.7 ratio (ref 0.0–4.4)
Cholesterol, Total: 229 mg/dL — ABNORMAL HIGH (ref 100–199)
HDL: 85 mg/dL (ref >39)
LDL Chol Calc (NIH): 131 mg/dL — ABNORMAL HIGH (ref 0–99)
Triglycerides: 75 mg/dL (ref 0–149)
VLDL Cholesterol Cal: 13 mg/dL (ref 5–40)

## 2024-09-24 LAB — COMPREHENSIVE METABOLIC PANEL WITH GFR
ALT: 22 IU/L (ref 0–32)
AST: 20 IU/L (ref 0–40)
Albumin: 4.6 g/dL (ref 3.9–4.9)
Alkaline Phosphatase: 58 IU/L (ref 49–135)
BUN/Creatinine Ratio: 14 (ref 12–28)
BUN: 13 mg/dL (ref 8–27)
Bilirubin Total: 0.8 mg/dL (ref 0.0–1.2)
CO2: 24 mmol/L (ref 20–29)
Calcium: 9.6 mg/dL (ref 8.7–10.3)
Chloride: 102 mmol/L (ref 96–106)
Creatinine, Ser: 0.9 mg/dL (ref 0.57–1.00)
Globulin, Total: 2.2 g/dL (ref 1.5–4.5)
Glucose: 91 mg/dL (ref 70–99)
Potassium: 4.3 mmol/L (ref 3.5–5.2)
Sodium: 141 mmol/L (ref 134–144)
Total Protein: 6.8 g/dL (ref 6.0–8.5)
eGFR: 72 mL/min/1.73 (ref >59)

## 2024-09-24 LAB — CBC
Hematocrit: 45.4 % (ref 34.0–46.6)
Hemoglobin: 15.5 g/dL (ref 11.1–15.9)
MCH: 32.3 pg (ref 26.6–33.0)
MCHC: 34.1 g/dL (ref 31.5–35.7)
MCV: 95 fL (ref 79–97)
Platelets: 202 x10E3/uL (ref 150–450)
RBC: 4.8 x10E6/uL (ref 3.77–5.28)
RDW: 12.2 % (ref 11.7–15.4)
WBC: 4.2 x10E3/uL (ref 3.4–10.8)

## 2024-09-24 LAB — TSH: TSH: 1.73 u[IU]/mL (ref 0.450–4.500)

## 2024-09-24 LAB — LIPOPROTEIN A (LPA): Lipoprotein (a): 88.7 nmol/L — AB (ref <75.0)

## 2024-09-24 MED ORDER — ATORVASTATIN CALCIUM 10 MG PO TABS: 10.0000 mg | ORAL_TABLET | Freq: Every day | ORAL | 3 refills | Status: AC

## 2024-09-24 NOTE — Telephone Encounter (Signed)
 Zio per [facility]

## 2024-09-24 NOTE — Telephone Encounter (Signed)
 Left message on My Chart with lab results per Dr. Karry note. Routed to PCP. Lipitor 10mg  1 tablet daily - Lipid, AST, ALT 6 weeks

## 2024-09-24 NOTE — Telephone Encounter (Signed)
 Lipitor 10mg  1 tablet daily to [facility] [place]

## 2024-10-07 DIAGNOSIS — M9902 Segmental and somatic dysfunction of thoracic region: Secondary | ICD-10-CM | POA: Diagnosis not present

## 2024-10-07 DIAGNOSIS — M9903 Segmental and somatic dysfunction of lumbar region: Secondary | ICD-10-CM | POA: Diagnosis not present

## 2024-10-07 DIAGNOSIS — M62830 Muscle spasm of back: Secondary | ICD-10-CM | POA: Diagnosis not present

## 2024-10-07 DIAGNOSIS — M9901 Segmental and somatic dysfunction of cervical region: Secondary | ICD-10-CM | POA: Diagnosis not present

## 2024-10-07 DIAGNOSIS — M9905 Segmental and somatic dysfunction of pelvic region: Secondary | ICD-10-CM | POA: Diagnosis not present

## 2024-10-07 DIAGNOSIS — M5386 Other specified dorsopathies, lumbar region: Secondary | ICD-10-CM | POA: Diagnosis not present

## 2024-10-07 DIAGNOSIS — M5032 Other cervical disc degeneration, mid-cervical region, unspecified level: Secondary | ICD-10-CM | POA: Diagnosis not present

## 2024-10-26 ENCOUNTER — Ambulatory Visit: Admitting: Cardiology

## 2024-11-09 ENCOUNTER — Ambulatory Visit: Admitting: Cardiology
